# Patient Record
Sex: Male | Born: 1970 | Marital: Married | Smoking: Former smoker
Health system: Southern US, Community
[De-identification: ages and names within clinical notes are randomized; demographics above are authoritative.]

## PROBLEM LIST (undated history)

## (undated) DIAGNOSIS — Q8781 Alport syndrome: Secondary | ICD-10-CM

## (undated) DIAGNOSIS — E785 Hyperlipidemia, unspecified: Secondary | ICD-10-CM

## (undated) DIAGNOSIS — N529 Male erectile dysfunction, unspecified: Secondary | ICD-10-CM

## (undated) HISTORY — DX: Hyperlipidemia, unspecified: E78.5

## (undated) HISTORY — PX: FIBULA FRACTURE SURGERY: SHX947

## (undated) HISTORY — DX: Male erectile dysfunction, unspecified: N52.9

## (undated) HISTORY — DX: Alport syndrome: Q87.81

---

## 2014-12-17 DIAGNOSIS — J309 Allergic rhinitis, unspecified: Secondary | ICD-10-CM | POA: Insufficient documentation

## 2014-12-17 DIAGNOSIS — R062 Wheezing: Secondary | ICD-10-CM

## 2014-12-30 ENCOUNTER — Ambulatory Visit (INDEPENDENT_AMBULATORY_CARE_PROVIDER_SITE_OTHER): Payer: 59 | Admitting: *Deleted

## 2014-12-30 DIAGNOSIS — J309 Allergic rhinitis, unspecified: Secondary | ICD-10-CM

## 2015-01-06 ENCOUNTER — Ambulatory Visit (INDEPENDENT_AMBULATORY_CARE_PROVIDER_SITE_OTHER): Payer: 59 | Admitting: *Deleted

## 2015-01-06 DIAGNOSIS — J309 Allergic rhinitis, unspecified: Secondary | ICD-10-CM | POA: Diagnosis not present

## 2015-01-10 ENCOUNTER — Ambulatory Visit (INDEPENDENT_AMBULATORY_CARE_PROVIDER_SITE_OTHER): Payer: 59

## 2015-01-10 DIAGNOSIS — J309 Allergic rhinitis, unspecified: Secondary | ICD-10-CM

## 2015-01-17 ENCOUNTER — Ambulatory Visit (INDEPENDENT_AMBULATORY_CARE_PROVIDER_SITE_OTHER): Payer: 59 | Admitting: *Deleted

## 2015-01-17 DIAGNOSIS — J309 Allergic rhinitis, unspecified: Secondary | ICD-10-CM | POA: Diagnosis not present

## 2015-01-24 ENCOUNTER — Ambulatory Visit (INDEPENDENT_AMBULATORY_CARE_PROVIDER_SITE_OTHER): Payer: 59

## 2015-01-24 DIAGNOSIS — J309 Allergic rhinitis, unspecified: Secondary | ICD-10-CM

## 2015-01-31 ENCOUNTER — Ambulatory Visit (INDEPENDENT_AMBULATORY_CARE_PROVIDER_SITE_OTHER): Payer: 59 | Admitting: *Deleted

## 2015-01-31 DIAGNOSIS — J309 Allergic rhinitis, unspecified: Secondary | ICD-10-CM | POA: Diagnosis not present

## 2015-02-09 ENCOUNTER — Ambulatory Visit (INDEPENDENT_AMBULATORY_CARE_PROVIDER_SITE_OTHER): Payer: 59

## 2015-02-09 DIAGNOSIS — J309 Allergic rhinitis, unspecified: Secondary | ICD-10-CM

## 2015-02-28 ENCOUNTER — Ambulatory Visit (INDEPENDENT_AMBULATORY_CARE_PROVIDER_SITE_OTHER): Payer: 59

## 2015-02-28 DIAGNOSIS — J309 Allergic rhinitis, unspecified: Secondary | ICD-10-CM | POA: Diagnosis not present

## 2015-03-16 ENCOUNTER — Encounter: Payer: Self-pay | Admitting: Internal Medicine

## 2015-03-16 ENCOUNTER — Ambulatory Visit (INDEPENDENT_AMBULATORY_CARE_PROVIDER_SITE_OTHER): Payer: 59

## 2015-03-16 ENCOUNTER — Ambulatory Visit (INDEPENDENT_AMBULATORY_CARE_PROVIDER_SITE_OTHER): Payer: 59 | Admitting: Internal Medicine

## 2015-03-16 VITALS — BP 130/90 | HR 72 | Temp 98.2°F | Resp 16 | Ht 69.0 in | Wt 206.0 lb

## 2015-03-16 DIAGNOSIS — R062 Wheezing: Secondary | ICD-10-CM | POA: Diagnosis not present

## 2015-03-16 DIAGNOSIS — J301 Allergic rhinitis due to pollen: Secondary | ICD-10-CM | POA: Diagnosis not present

## 2015-03-16 MED ORDER — EPINEPHRINE 0.3 MG/0.3ML IJ SOAJ: INTRAMUSCULAR | Status: DC

## 2015-03-16 MED ORDER — FLUTICASONE PROPIONATE 50 MCG/ACT NA SUSP: NASAL | Status: DC

## 2015-03-16 MED ORDER — ALBUTEROL SULFATE HFA 108 (90 BASE) MCG/ACT IN AERS: 2.0000 | INHALATION_SPRAY | RESPIRATORY_TRACT | Status: DC | PRN

## 2015-03-16 NOTE — Assessment & Plan Note (Signed)
   Currently well controlled  Use pro-air prior to exercise and as needed

## 2015-03-16 NOTE — Assessment & Plan Note (Signed)
   On immunotherapy, currently well controlled  This winter, he may try using Zyrtec as needed. If symptoms remain well controlled he may also use fluticasone as needed  During grass season, would recommend using both medications regularly  Has EpiPen-educated on use

## 2015-03-16 NOTE — Progress Notes (Signed)
History of Present Illness: Kenneth Jefferson is a 44 y.o. male presenting for follow-up.  HPI Comments: Allergic rhinitis on immunotherapy: Start 10/19/2013, maintenance reached 09/21/2014. He is on injections every other week and he has been having good symptom control. He has not had any severe shot reactions or interval sinus infections. He feels benefit since starting his injections. He uses his antihistamine and Flonase on a regular basis.  Wheezing: Patient rarely requires albuterol and in those cases, it is for shortness of breath associated with strenuous exercise.   Assessment and Plan: Allergic rhinitis  On immunotherapy, currently well controlled  This winter, he may try using Zyrtec as needed. If symptoms remain well controlled he may also use fluticasone as needed  During grass season, would recommend using both medications regularly  Has EpiPen-educated on use  Wheeze  Currently well controlled  Use pro-air prior to exercise and as needed   Return in about 1 year (around 03/15/2016).  Medications ordered this encounter:  Meds ordered this encounter  Medications  . DISCONTD: vitamin B-12 (CYANOCOBALAMIN) 100 MCG tablet    Sig: Take by mouth.  . fexofenadine (ALLEGRA) 180 MG tablet    Sig: Take 180 mg by mouth.  . Flaxseed, Linseed, (FLAX SEED OIL) 1300 MG CAPS    Sig: Take by mouth.  Marland Kitchen. lisinopril (PRINIVIL,ZESTRIL) 5 MG tablet    Sig: Take 5 mg by mouth.  . Multiple Vitamin (DAILY VALUE MULTIVITAMIN) TABS    Sig: Take by mouth.  . sildenafil (VIAGRA) 100 MG tablet    Sig: Take as directed.  Marland Kitchen. DISCONTD: EPINEPHrine (EPIPEN 2-PAK) 0.3 mg/0.3 mL IJ SOAJ injection    Sig: Take as directed.  Marland Kitchen. DISCONTD: fluticasone (FLONASE) 50 MCG/ACT nasal spray    Sig: Take as directed.  Marland Kitchen. DISCONTD: tamsulosin (FLOMAX) 0.4 MG CAPS capsule    Sig: Take 0.4 mg by mouth.  . DISCONTD: lisinopril (PRINIVIL,ZESTRIL) 5 MG tablet    Sig:   . DISCONTD: tamsulosin (FLOMAX) 0.4 MG  CAPS capsule    Sig:   . atorvastatin (LIPITOR) 20 MG tablet    Sig:   . buPROPion (WELLBUTRIN XL) 150 MG 24 hr tablet    Sig:   . DISCONTD: lisinopril (PRINIVIL,ZESTRIL) 5 MG tablet    Sig:   . DISCONTD: tamsulosin (FLOMAX) 0.4 MG CAPS capsule    Sig:   . aspirin 81 MG tablet    Sig: Take 81 mg by mouth daily.  . NON FORMULARY    Sig: Inject as directed. ALLERGEN IMMUNOTHERAPY  . fluticasone (FLONASE) 50 MCG/ACT nasal spray    Sig: TWO SPRAYS EACH NOSTRIL ONCE A DAY FOR NASAL CONGESTION OR DRAINAGE.    Dispense:  16 g    Refill:  5  . albuterol (PROAIR HFA) 108 (90 BASE) MCG/ACT inhaler    Sig: Inhale 2 puffs into the lungs every 4 (four) hours as needed for wheezing or shortness of breath.    Dispense:  8 g    Refill:  2  . EPINEPHrine (EPIPEN 2-PAK) 0.3 mg/0.3 mL IJ SOAJ injection    Sig: USE AS DIRECTED FOR SEVERE ALLERGIC REACTION.    Dispense:  2 Device    Refill:  2    Diagnostics: Spirometry: FEV1 3 L or 77 %, FEV1/FVC  84 %.  This is consistent with mild restriction, no reversibility in the past.  Physical Exam: BP 130/90 mmHg  Pulse 72  Temp(Src) 98.2 F (36.8 C) (Oral)  Resp 16  Ht   (1.753 m)  Wt 206 lb (93.441 kg)  BMI 30.41 kg/m2   Physical Exam  Constitutional: He appears well-developed.  HENT:  Nose: Nose normal.  Mouth/Throat: Oropharynx is clear and moist.  Eyes: Conjunctivae are normal.  Cardiovascular: Normal rate, regular rhythm and normal heart sounds.   No murmur heard. Pulmonary/Chest: Effort normal and breath sounds normal. No respiratory distress. He has no wheezes.  Abdominal: Soft. Bowel sounds are normal.  Musculoskeletal: He exhibits no edema.  Lymphadenopathy:    He has no cervical adenopathy.  Neurological: He is alert.  Skin: No rash noted.  Vitals reviewed.   Medications: Current outpatient prescriptions:  .  albuterol (PROAIR HFA) 108 (90 BASE) MCG/ACT inhaler, Inhale 2 puffs into the lungs every 4 (four) hours as  needed for wheezing or shortness of breath., Disp: 8 g, Rfl: 2 .  aspirin 81 MG tablet, Take 81 mg by mouth daily., Disp: , Rfl:  .  atorvastatin (LIPITOR) 20 MG tablet, , Disp: , Rfl:  .  buPROPion (WELLBUTRIN XL) 150 MG 24 hr tablet, , Disp: , Rfl:  .  EPINEPHrine (EPIPEN 2-PAK) 0.3 mg/0.3 mL IJ SOAJ injection, Inject into the muscle once., Disp: , Rfl:  .  fexofenadine (ALLEGRA) 180 MG tablet, Take 180 mg by mouth., Disp: , Rfl:  .  Flaxseed, Linseed, (FLAX SEED OIL) 1300 MG CAPS, Take by mouth., Disp: , Rfl:  .  fluticasone (FLONASE) 50 MCG/ACT nasal spray, Place 2 sprays into both nostrils daily. Reported on 03/16/2015, Disp: , Rfl:  .  lisinopril (PRINIVIL,ZESTRIL) 5 MG tablet, Take 5 mg by mouth., Disp: , Rfl:  .  Multiple Vitamin (DAILY VALUE MULTIVITAMIN) TABS, Take by mouth., Disp: , Rfl:  .  NON FORMULARY, Inject as directed. ALLERGEN IMMUNOTHERAPY, Disp: , Rfl:  .  sildenafil (VIAGRA) 100 MG tablet, Take as directed., Disp: , Rfl:  .  cetirizine (ZYRTEC) 10 MG tablet, Take 10 mg by mouth daily. Reported on 03/16/2015, Disp: , Rfl:  .  EPINEPHrine (EPIPEN 2-PAK) 0.3 mg/0.3 mL IJ SOAJ injection, USE AS DIRECTED FOR SEVERE ALLERGIC REACTION., Disp: 2 Device, Rfl: 2 .  fluticasone (FLONASE) 50 MCG/ACT nasal spray, TWO SPRAYS EACH NOSTRIL ONCE A DAY FOR NASAL CONGESTION OR DRAINAGE., Disp: 16 g, Rfl: 5  Drug Allergies:  No Known Allergies  ROS: Per HPI unless specifically indicated below Review of Systems  Thank you for the opportunity to care for this patient.  Please do not hesitate to contact me with questions.

## 2015-03-16 NOTE — Patient Instructions (Signed)
Allergic rhinitis  On immunotherapy, currently well controlled  This winter, he may try using Zyrtec as needed. If symptoms remain well controlled he may also use fluticasone as needed  During grass season, would recommend using both medications regularly  Has EpiPen-educated on use  Wheeze  Currently well controlled  Use pro-air prior to exercise and as needed

## 2015-03-29 ENCOUNTER — Ambulatory Visit (INDEPENDENT_AMBULATORY_CARE_PROVIDER_SITE_OTHER): Payer: 59 | Admitting: *Deleted

## 2015-03-29 DIAGNOSIS — J309 Allergic rhinitis, unspecified: Secondary | ICD-10-CM

## 2015-03-30 DIAGNOSIS — J3089 Other allergic rhinitis: Secondary | ICD-10-CM | POA: Diagnosis not present

## 2015-03-31 DIAGNOSIS — J301 Allergic rhinitis due to pollen: Secondary | ICD-10-CM | POA: Diagnosis not present

## 2015-04-13 ENCOUNTER — Ambulatory Visit (INDEPENDENT_AMBULATORY_CARE_PROVIDER_SITE_OTHER): Payer: BLUE CROSS/BLUE SHIELD | Admitting: *Deleted

## 2015-04-13 DIAGNOSIS — J309 Allergic rhinitis, unspecified: Secondary | ICD-10-CM | POA: Diagnosis not present

## 2015-04-19 DIAGNOSIS — N401 Enlarged prostate with lower urinary tract symptoms: Secondary | ICD-10-CM | POA: Insufficient documentation

## 2015-04-19 DIAGNOSIS — Q8781 Alport syndrome: Secondary | ICD-10-CM | POA: Insufficient documentation

## 2015-04-19 DIAGNOSIS — E785 Hyperlipidemia, unspecified: Secondary | ICD-10-CM | POA: Insufficient documentation

## 2015-04-26 ENCOUNTER — Ambulatory Visit (INDEPENDENT_AMBULATORY_CARE_PROVIDER_SITE_OTHER): Payer: BLUE CROSS/BLUE SHIELD | Admitting: *Deleted

## 2015-04-26 DIAGNOSIS — J309 Allergic rhinitis, unspecified: Secondary | ICD-10-CM | POA: Diagnosis not present

## 2015-05-10 ENCOUNTER — Telehealth: Payer: Self-pay | Admitting: Internal Medicine

## 2015-05-10 ENCOUNTER — Ambulatory Visit (INDEPENDENT_AMBULATORY_CARE_PROVIDER_SITE_OTHER): Payer: BLUE CROSS/BLUE SHIELD | Admitting: *Deleted

## 2015-05-10 DIAGNOSIS — J309 Allergic rhinitis, unspecified: Secondary | ICD-10-CM | POA: Diagnosis not present

## 2015-05-10 NOTE — Telephone Encounter (Signed)
bc still has not paid - will keep checking & let him know next week

## 2015-05-10 NOTE — Telephone Encounter (Signed)
Kenneth Jefferson came in this morning and made a payment but he also wanted to check to see if [facility] was paying for his injections. He picked the [employer] up at the first of the year and no charges have come across on my screen that I could see to tell him if they are paying. Can you look at it for him. He will be back in for another shot next week and said he would check back in with [place] then.

## 2015-05-16 ENCOUNTER — Ambulatory Visit (INDEPENDENT_AMBULATORY_CARE_PROVIDER_SITE_OTHER): Payer: BLUE CROSS/BLUE SHIELD

## 2015-05-16 ENCOUNTER — Telehealth: Payer: Self-pay | Admitting: *Deleted

## 2015-05-16 DIAGNOSIS — J309 Allergic rhinitis, unspecified: Secondary | ICD-10-CM | POA: Diagnosis not present

## 2015-05-16 NOTE — Telephone Encounter (Signed)
Kenneth Jefferson,   Patient would like to know if his insurance has bad anything for this year. He is wanting to stay on top of his bill. Please call patient.

## 2015-05-16 NOTE — Telephone Encounter (Signed)
Per Olegario Messier let patient know [employer] paid for the 04/13/15 injection in full. I informed patient.

## 2015-05-26 ENCOUNTER — Ambulatory Visit (INDEPENDENT_AMBULATORY_CARE_PROVIDER_SITE_OTHER): Payer: BLUE CROSS/BLUE SHIELD

## 2015-05-26 DIAGNOSIS — J309 Allergic rhinitis, unspecified: Secondary | ICD-10-CM

## 2015-05-31 ENCOUNTER — Ambulatory Visit (INDEPENDENT_AMBULATORY_CARE_PROVIDER_SITE_OTHER): Payer: BLUE CROSS/BLUE SHIELD

## 2015-05-31 DIAGNOSIS — J309 Allergic rhinitis, unspecified: Secondary | ICD-10-CM | POA: Diagnosis not present

## 2015-06-09 ENCOUNTER — Ambulatory Visit (INDEPENDENT_AMBULATORY_CARE_PROVIDER_SITE_OTHER): Payer: BLUE CROSS/BLUE SHIELD | Admitting: *Deleted

## 2015-06-09 DIAGNOSIS — J309 Allergic rhinitis, unspecified: Secondary | ICD-10-CM

## 2015-06-20 ENCOUNTER — Ambulatory Visit (INDEPENDENT_AMBULATORY_CARE_PROVIDER_SITE_OTHER): Payer: BLUE CROSS/BLUE SHIELD

## 2015-06-20 ENCOUNTER — Telehealth: Payer: Self-pay | Admitting: Pediatrics

## 2015-06-20 DIAGNOSIS — J309 Allergic rhinitis, unspecified: Secondary | ICD-10-CM

## 2015-06-20 NOTE — Telephone Encounter (Signed)
Barbara CowerJason came into the office for his injection and requested a statement for the 2016 year. He would like for you to send it to him by mail please. Thank you!

## 2015-06-21 NOTE — Telephone Encounter (Signed)
Left message explaining bill - told him to call me back with any questions

## 2015-06-30 ENCOUNTER — Ambulatory Visit (INDEPENDENT_AMBULATORY_CARE_PROVIDER_SITE_OTHER): Payer: BLUE CROSS/BLUE SHIELD | Admitting: *Deleted

## 2015-06-30 DIAGNOSIS — J309 Allergic rhinitis, unspecified: Secondary | ICD-10-CM

## 2015-07-11 ENCOUNTER — Ambulatory Visit (INDEPENDENT_AMBULATORY_CARE_PROVIDER_SITE_OTHER): Payer: BLUE CROSS/BLUE SHIELD

## 2015-07-11 DIAGNOSIS — J309 Allergic rhinitis, unspecified: Secondary | ICD-10-CM

## 2015-07-13 DIAGNOSIS — J3089 Other allergic rhinitis: Secondary | ICD-10-CM | POA: Diagnosis not present

## 2015-07-14 DIAGNOSIS — J301 Allergic rhinitis due to pollen: Secondary | ICD-10-CM | POA: Diagnosis not present

## 2015-07-18 ENCOUNTER — Ambulatory Visit (INDEPENDENT_AMBULATORY_CARE_PROVIDER_SITE_OTHER): Payer: BLUE CROSS/BLUE SHIELD

## 2015-07-18 DIAGNOSIS — J309 Allergic rhinitis, unspecified: Secondary | ICD-10-CM | POA: Diagnosis not present

## 2015-08-01 ENCOUNTER — Ambulatory Visit (INDEPENDENT_AMBULATORY_CARE_PROVIDER_SITE_OTHER): Payer: BLUE CROSS/BLUE SHIELD

## 2015-08-01 DIAGNOSIS — J309 Allergic rhinitis, unspecified: Secondary | ICD-10-CM

## 2015-08-16 ENCOUNTER — Ambulatory Visit (INDEPENDENT_AMBULATORY_CARE_PROVIDER_SITE_OTHER): Payer: BLUE CROSS/BLUE SHIELD

## 2015-08-16 DIAGNOSIS — J309 Allergic rhinitis, unspecified: Secondary | ICD-10-CM | POA: Diagnosis not present

## 2015-08-23 ENCOUNTER — Ambulatory Visit (INDEPENDENT_AMBULATORY_CARE_PROVIDER_SITE_OTHER): Payer: BLUE CROSS/BLUE SHIELD

## 2015-08-23 DIAGNOSIS — J309 Allergic rhinitis, unspecified: Secondary | ICD-10-CM

## 2015-08-31 ENCOUNTER — Ambulatory Visit (INDEPENDENT_AMBULATORY_CARE_PROVIDER_SITE_OTHER): Payer: BLUE CROSS/BLUE SHIELD

## 2015-08-31 DIAGNOSIS — J309 Allergic rhinitis, unspecified: Secondary | ICD-10-CM | POA: Diagnosis not present

## 2015-09-07 ENCOUNTER — Ambulatory Visit (INDEPENDENT_AMBULATORY_CARE_PROVIDER_SITE_OTHER): Payer: BLUE CROSS/BLUE SHIELD

## 2015-09-07 DIAGNOSIS — J309 Allergic rhinitis, unspecified: Secondary | ICD-10-CM

## 2015-09-19 ENCOUNTER — Ambulatory Visit (INDEPENDENT_AMBULATORY_CARE_PROVIDER_SITE_OTHER): Payer: BLUE CROSS/BLUE SHIELD

## 2015-09-19 DIAGNOSIS — J309 Allergic rhinitis, unspecified: Secondary | ICD-10-CM | POA: Diagnosis not present

## 2015-10-05 ENCOUNTER — Ambulatory Visit (INDEPENDENT_AMBULATORY_CARE_PROVIDER_SITE_OTHER): Payer: BLUE CROSS/BLUE SHIELD

## 2015-10-05 DIAGNOSIS — J309 Allergic rhinitis, unspecified: Secondary | ICD-10-CM

## 2015-10-17 ENCOUNTER — Ambulatory Visit (INDEPENDENT_AMBULATORY_CARE_PROVIDER_SITE_OTHER): Payer: BLUE CROSS/BLUE SHIELD

## 2015-10-17 DIAGNOSIS — J309 Allergic rhinitis, unspecified: Secondary | ICD-10-CM

## 2015-10-31 ENCOUNTER — Ambulatory Visit (INDEPENDENT_AMBULATORY_CARE_PROVIDER_SITE_OTHER): Payer: BLUE CROSS/BLUE SHIELD

## 2015-10-31 DIAGNOSIS — J309 Allergic rhinitis, unspecified: Secondary | ICD-10-CM

## 2015-11-09 DIAGNOSIS — J3089 Other allergic rhinitis: Secondary | ICD-10-CM | POA: Diagnosis not present

## 2015-11-10 DIAGNOSIS — J301 Allergic rhinitis due to pollen: Secondary | ICD-10-CM | POA: Diagnosis not present

## 2015-11-15 ENCOUNTER — Ambulatory Visit (INDEPENDENT_AMBULATORY_CARE_PROVIDER_SITE_OTHER): Payer: BLUE CROSS/BLUE SHIELD

## 2015-11-15 DIAGNOSIS — J309 Allergic rhinitis, unspecified: Secondary | ICD-10-CM | POA: Diagnosis not present

## 2015-11-28 ENCOUNTER — Ambulatory Visit (INDEPENDENT_AMBULATORY_CARE_PROVIDER_SITE_OTHER): Payer: BLUE CROSS/BLUE SHIELD

## 2015-11-28 DIAGNOSIS — J309 Allergic rhinitis, unspecified: Secondary | ICD-10-CM

## 2015-12-12 ENCOUNTER — Ambulatory Visit (INDEPENDENT_AMBULATORY_CARE_PROVIDER_SITE_OTHER): Payer: BLUE CROSS/BLUE SHIELD

## 2015-12-12 DIAGNOSIS — J309 Allergic rhinitis, unspecified: Secondary | ICD-10-CM

## 2015-12-22 ENCOUNTER — Ambulatory Visit (INDEPENDENT_AMBULATORY_CARE_PROVIDER_SITE_OTHER): Payer: BLUE CROSS/BLUE SHIELD

## 2015-12-22 DIAGNOSIS — J309 Allergic rhinitis, unspecified: Secondary | ICD-10-CM | POA: Diagnosis not present

## 2015-12-27 ENCOUNTER — Ambulatory Visit (INDEPENDENT_AMBULATORY_CARE_PROVIDER_SITE_OTHER): Payer: BLUE CROSS/BLUE SHIELD

## 2015-12-27 DIAGNOSIS — J309 Allergic rhinitis, unspecified: Secondary | ICD-10-CM

## 2016-01-05 ENCOUNTER — Ambulatory Visit (INDEPENDENT_AMBULATORY_CARE_PROVIDER_SITE_OTHER): Payer: BLUE CROSS/BLUE SHIELD

## 2016-01-05 DIAGNOSIS — J309 Allergic rhinitis, unspecified: Secondary | ICD-10-CM | POA: Diagnosis not present

## 2016-01-12 ENCOUNTER — Ambulatory Visit (INDEPENDENT_AMBULATORY_CARE_PROVIDER_SITE_OTHER): Payer: BLUE CROSS/BLUE SHIELD

## 2016-01-12 DIAGNOSIS — J309 Allergic rhinitis, unspecified: Secondary | ICD-10-CM | POA: Diagnosis not present

## 2016-02-02 ENCOUNTER — Ambulatory Visit (INDEPENDENT_AMBULATORY_CARE_PROVIDER_SITE_OTHER): Payer: BLUE CROSS/BLUE SHIELD

## 2016-02-02 DIAGNOSIS — J309 Allergic rhinitis, unspecified: Secondary | ICD-10-CM

## 2016-02-14 ENCOUNTER — Ambulatory Visit (INDEPENDENT_AMBULATORY_CARE_PROVIDER_SITE_OTHER): Payer: BLUE CROSS/BLUE SHIELD

## 2016-02-14 DIAGNOSIS — J309 Allergic rhinitis, unspecified: Secondary | ICD-10-CM | POA: Diagnosis not present

## 2016-02-28 ENCOUNTER — Ambulatory Visit (INDEPENDENT_AMBULATORY_CARE_PROVIDER_SITE_OTHER): Payer: BLUE CROSS/BLUE SHIELD

## 2016-02-28 DIAGNOSIS — J309 Allergic rhinitis, unspecified: Secondary | ICD-10-CM | POA: Diagnosis not present

## 2016-03-08 DIAGNOSIS — J301 Allergic rhinitis due to pollen: Secondary | ICD-10-CM

## 2016-03-09 DIAGNOSIS — J3089 Other allergic rhinitis: Secondary | ICD-10-CM | POA: Diagnosis not present

## 2016-03-12 ENCOUNTER — Ambulatory Visit (INDEPENDENT_AMBULATORY_CARE_PROVIDER_SITE_OTHER): Payer: BLUE CROSS/BLUE SHIELD

## 2016-03-12 DIAGNOSIS — J309 Allergic rhinitis, unspecified: Secondary | ICD-10-CM | POA: Diagnosis not present

## 2016-03-29 ENCOUNTER — Ambulatory Visit (INDEPENDENT_AMBULATORY_CARE_PROVIDER_SITE_OTHER): Payer: BLUE CROSS/BLUE SHIELD

## 2016-03-29 DIAGNOSIS — J309 Allergic rhinitis, unspecified: Secondary | ICD-10-CM | POA: Diagnosis not present

## 2016-04-11 ENCOUNTER — Ambulatory Visit (INDEPENDENT_AMBULATORY_CARE_PROVIDER_SITE_OTHER): Payer: BLUE CROSS/BLUE SHIELD | Admitting: *Deleted

## 2016-04-11 ENCOUNTER — Telehealth: Payer: Self-pay | Admitting: *Deleted

## 2016-04-11 DIAGNOSIS — J309 Allergic rhinitis, unspecified: Secondary | ICD-10-CM | POA: Diagnosis not present

## 2016-04-11 NOTE — Telephone Encounter (Signed)
Mailed today

## 2016-04-11 NOTE — Telephone Encounter (Signed)
Pt need a list of payments made in 2017 mailed to him.

## 2016-04-20 ENCOUNTER — Ambulatory Visit (INDEPENDENT_AMBULATORY_CARE_PROVIDER_SITE_OTHER): Payer: BLUE CROSS/BLUE SHIELD

## 2016-04-20 DIAGNOSIS — J309 Allergic rhinitis, unspecified: Secondary | ICD-10-CM

## 2016-04-23 ENCOUNTER — Other Ambulatory Visit: Payer: Self-pay | Admitting: Allergy

## 2016-04-23 ENCOUNTER — Ambulatory Visit (INDEPENDENT_AMBULATORY_CARE_PROVIDER_SITE_OTHER): Payer: BLUE CROSS/BLUE SHIELD

## 2016-04-23 DIAGNOSIS — J309 Allergic rhinitis, unspecified: Secondary | ICD-10-CM | POA: Diagnosis not present

## 2016-04-23 MED ORDER — EPINEPHRINE 0.3 MG/0.3ML IJ SOAJ: 0.3000 mg | Freq: Once | INTRAMUSCULAR | 1 refills | Status: AC

## 2016-05-08 ENCOUNTER — Ambulatory Visit (INDEPENDENT_AMBULATORY_CARE_PROVIDER_SITE_OTHER): Payer: BLUE CROSS/BLUE SHIELD

## 2016-05-08 DIAGNOSIS — J309 Allergic rhinitis, unspecified: Secondary | ICD-10-CM | POA: Diagnosis not present

## 2016-05-14 ENCOUNTER — Ambulatory Visit (INDEPENDENT_AMBULATORY_CARE_PROVIDER_SITE_OTHER): Payer: BLUE CROSS/BLUE SHIELD

## 2016-05-14 DIAGNOSIS — J309 Allergic rhinitis, unspecified: Secondary | ICD-10-CM

## 2016-05-28 ENCOUNTER — Ambulatory Visit (INDEPENDENT_AMBULATORY_CARE_PROVIDER_SITE_OTHER): Payer: BLUE CROSS/BLUE SHIELD

## 2016-05-28 DIAGNOSIS — J309 Allergic rhinitis, unspecified: Secondary | ICD-10-CM

## 2016-06-05 NOTE — Addendum Note (Signed)
Addended by: Berna BueWHITAKER, CARRIE L on: 06/05/2016 08:50 AM   Modules accepted: Orders

## 2016-06-11 ENCOUNTER — Ambulatory Visit (INDEPENDENT_AMBULATORY_CARE_PROVIDER_SITE_OTHER): Payer: BLUE CROSS/BLUE SHIELD

## 2016-06-11 DIAGNOSIS — J309 Allergic rhinitis, unspecified: Secondary | ICD-10-CM | POA: Diagnosis not present

## 2016-06-28 ENCOUNTER — Ambulatory Visit (INDEPENDENT_AMBULATORY_CARE_PROVIDER_SITE_OTHER): Payer: BLUE CROSS/BLUE SHIELD | Admitting: *Deleted

## 2016-06-28 DIAGNOSIS — J309 Allergic rhinitis, unspecified: Secondary | ICD-10-CM | POA: Diagnosis not present

## 2016-07-10 ENCOUNTER — Ambulatory Visit (INDEPENDENT_AMBULATORY_CARE_PROVIDER_SITE_OTHER): Payer: BLUE CROSS/BLUE SHIELD

## 2016-07-10 DIAGNOSIS — J309 Allergic rhinitis, unspecified: Secondary | ICD-10-CM | POA: Diagnosis not present

## 2016-07-23 ENCOUNTER — Ambulatory Visit (INDEPENDENT_AMBULATORY_CARE_PROVIDER_SITE_OTHER): Payer: BLUE CROSS/BLUE SHIELD

## 2016-07-23 DIAGNOSIS — J309 Allergic rhinitis, unspecified: Secondary | ICD-10-CM | POA: Diagnosis not present

## 2016-07-25 DIAGNOSIS — J3089 Other allergic rhinitis: Secondary | ICD-10-CM | POA: Diagnosis not present

## 2016-08-06 ENCOUNTER — Ambulatory Visit (INDEPENDENT_AMBULATORY_CARE_PROVIDER_SITE_OTHER): Payer: BLUE CROSS/BLUE SHIELD

## 2016-08-06 DIAGNOSIS — J309 Allergic rhinitis, unspecified: Secondary | ICD-10-CM | POA: Diagnosis not present

## 2016-08-20 ENCOUNTER — Ambulatory Visit (INDEPENDENT_AMBULATORY_CARE_PROVIDER_SITE_OTHER): Payer: BLUE CROSS/BLUE SHIELD

## 2016-08-20 DIAGNOSIS — J309 Allergic rhinitis, unspecified: Secondary | ICD-10-CM | POA: Diagnosis not present

## 2016-08-28 ENCOUNTER — Ambulatory Visit (INDEPENDENT_AMBULATORY_CARE_PROVIDER_SITE_OTHER): Payer: BLUE CROSS/BLUE SHIELD

## 2016-08-28 DIAGNOSIS — J309 Allergic rhinitis, unspecified: Secondary | ICD-10-CM

## 2016-09-11 ENCOUNTER — Ambulatory Visit (INDEPENDENT_AMBULATORY_CARE_PROVIDER_SITE_OTHER): Payer: BLUE CROSS/BLUE SHIELD

## 2016-09-11 DIAGNOSIS — J309 Allergic rhinitis, unspecified: Secondary | ICD-10-CM

## 2016-09-17 ENCOUNTER — Ambulatory Visit (INDEPENDENT_AMBULATORY_CARE_PROVIDER_SITE_OTHER): Payer: BLUE CROSS/BLUE SHIELD | Admitting: *Deleted

## 2016-09-17 DIAGNOSIS — J309 Allergic rhinitis, unspecified: Secondary | ICD-10-CM

## 2016-09-27 ENCOUNTER — Ambulatory Visit (INDEPENDENT_AMBULATORY_CARE_PROVIDER_SITE_OTHER): Payer: BLUE CROSS/BLUE SHIELD | Admitting: *Deleted

## 2016-09-27 DIAGNOSIS — J309 Allergic rhinitis, unspecified: Secondary | ICD-10-CM | POA: Diagnosis not present

## 2016-10-10 ENCOUNTER — Ambulatory Visit (INDEPENDENT_AMBULATORY_CARE_PROVIDER_SITE_OTHER): Payer: BLUE CROSS/BLUE SHIELD | Admitting: *Deleted

## 2016-10-10 DIAGNOSIS — J309 Allergic rhinitis, unspecified: Secondary | ICD-10-CM | POA: Diagnosis not present

## 2016-10-22 ENCOUNTER — Ambulatory Visit (INDEPENDENT_AMBULATORY_CARE_PROVIDER_SITE_OTHER): Payer: BLUE CROSS/BLUE SHIELD

## 2016-10-22 DIAGNOSIS — J309 Allergic rhinitis, unspecified: Secondary | ICD-10-CM | POA: Diagnosis not present

## 2016-11-05 NOTE — Progress Notes (Signed)
VIALS EXP 11-08-17 

## 2016-11-08 ENCOUNTER — Ambulatory Visit (INDEPENDENT_AMBULATORY_CARE_PROVIDER_SITE_OTHER): Payer: BLUE CROSS/BLUE SHIELD

## 2016-11-08 DIAGNOSIS — J309 Allergic rhinitis, unspecified: Secondary | ICD-10-CM

## 2016-11-09 DIAGNOSIS — J3089 Other allergic rhinitis: Secondary | ICD-10-CM | POA: Diagnosis not present

## 2016-11-21 ENCOUNTER — Ambulatory Visit (INDEPENDENT_AMBULATORY_CARE_PROVIDER_SITE_OTHER): Payer: BLUE CROSS/BLUE SHIELD

## 2016-11-21 DIAGNOSIS — J309 Allergic rhinitis, unspecified: Secondary | ICD-10-CM

## 2016-12-06 ENCOUNTER — Ambulatory Visit (INDEPENDENT_AMBULATORY_CARE_PROVIDER_SITE_OTHER): Payer: BLUE CROSS/BLUE SHIELD

## 2016-12-06 DIAGNOSIS — J309 Allergic rhinitis, unspecified: Secondary | ICD-10-CM

## 2016-12-13 DIAGNOSIS — I10 Essential (primary) hypertension: Secondary | ICD-10-CM | POA: Insufficient documentation

## 2016-12-25 ENCOUNTER — Ambulatory Visit: Payer: Self-pay

## 2016-12-25 ENCOUNTER — Ambulatory Visit (INDEPENDENT_AMBULATORY_CARE_PROVIDER_SITE_OTHER): Payer: BLUE CROSS/BLUE SHIELD | Admitting: Pediatrics

## 2016-12-25 ENCOUNTER — Encounter: Payer: Self-pay | Admitting: Pediatrics

## 2016-12-25 VITALS — BP 142/86 | HR 80 | Temp 98.1°F | Resp 16

## 2016-12-25 DIAGNOSIS — J309 Allergic rhinitis, unspecified: Secondary | ICD-10-CM | POA: Diagnosis not present

## 2016-12-25 DIAGNOSIS — J4520 Mild intermittent asthma, uncomplicated: Secondary | ICD-10-CM

## 2016-12-25 DIAGNOSIS — I10 Essential (primary) hypertension: Secondary | ICD-10-CM | POA: Insufficient documentation

## 2016-12-25 MED ORDER — ALBUTEROL SULFATE HFA 108 (90 BASE) MCG/ACT IN AERS: 2.0000 | INHALATION_SPRAY | RESPIRATORY_TRACT | 1 refills | Status: DC | PRN

## 2016-12-25 MED ORDER — FLUTICASONE PROPIONATE 50 MCG/ACT NA SUSP: NASAL | 5 refills | Status: DC

## 2016-12-25 MED ORDER — EPINEPHRINE 0.3 MG/0.3ML IJ SOAJ: INTRAMUSCULAR | 1 refills | Status: DC

## 2016-12-25 NOTE — Patient Instructions (Signed)
Zyrtec 10 mg-take 1 tablet once a day if needed for runny nose Fluticasone-2 sprays per nostril once a day if needed for stuffy nose Pro-air-2 puffs every 4 hours if needed for wheezing or coughing spells. You may use Pro-air 2 puffs 5-15 minutes before exercise You  should have a flu vaccination this fall Continue on your other medications Call us if you're not doing well on this treatment plan Allergy injections every 3 weeks once you have reached maintenance dose

## 2016-12-25 NOTE — Progress Notes (Addendum)
[address] [place] [address] Dept: [phone]  FOLLOW UP NOTE  Patient ID: Kenneth Jefferson, male    DOB: 03-18-1971  Age: 46 y.o. MRN: [id number] Date of Office Visit: 12/25/2016  Assessment  Chief Complaint: Allergic Rhinitis  (doing well) and Wheezing (doing well)  HPI Kenneth Jefferson presents for follow-up of asthma and allergic rhinitis. His asthma is well controlled and he uses Pro-air once or twice every 3 months. His nasal symptoms are dramatically improved since he was started on allergy injections. He has been on allergy injections every 2 weeks for the past year. He does not have to use fluticasone on a regular basis. He uses Zyrtec 10 mg once a day if needed. His blood pressure is well controlled on lisinopril  Current medications are outlined in the chart   Drug Allergies:  No Known Allergies  Physical Exam: BP (!) 142/86 (BP Location: Left Arm, Patient Position: Sitting, Cuff Size: Normal)   Pulse 80   Temp 98.1 F (36.7 C) (Oral)   Resp 16   SpO2 95%    Physical Exam  Constitutional: He is oriented to person, place, and time. He appears well-developed and well-nourished.  HENT:  Eyes normal. Ears normal. Nose normal. Pharynx normal.  Neck: Neck supple.  Cardiovascular:  S1 and S2 normal no murmurs  Pulmonary/Chest:  Clear to percussion and auscultation  Lymphadenopathy:    He has no cervical adenopathy.  Neurological: He is alert and oriented to person, place, and time.  Psychiatric: He has a normal mood and affect. His behavior is normal. Judgment and thought content normal.  Vitals reviewed.   Diagnostics:  FVC 3.77 L FEV1 3.19 L. Predicted FVC 4.93 L predicted FEV1 3.85 L-the spirometry is in the normal range  Assessment and Plan: 1. Mild intermittent asthma without complication   2. Allergic rhinitis, unspecified seasonality, unspecified trigger   3. Essential hypertension     Meds ordered this encounter  Medications  . albuterol  (PROAIR HFA) 108 (90 Base) MCG/ACT inhaler    Sig: Inhale 2 puffs into the lungs every 4 (four) hours as needed for wheezing or shortness of breath.    Dispense:  1 Inhaler    Refill:  1    Please keep rx on file. Pt. Will call when needed  . fluticasone (FLONASE) 50 MCG/ACT nasal spray    Sig: 2 sprays per nostril if needed for stuffy nose.    Dispense:  16 g    Refill:  5    Please keep rx on file. Pt. Will call when needed.  Marland Kitchen EPINEPHrine (EPIPEN 2-PAK) 0.3 mg/0.3 mL IJ SOAJ injection    Sig: USE AS DIRECTED FOR SEVERE ALLERGIC REACTION.    Dispense:  2 Device    Refill:  1    Dispense mylan generic or brand name whichever is cheaper with pt's insurance. Please keep rx on file. Pt. Will call when needed.    Patient Instructions  Zyrtec 10 mg-take 1 tablet once a day if needed for runny nose Fluticasone-2 sprays per nostril once a day if needed for stuffy nose Pro-air-2 puffs every 4 hours if needed for wheezing or coughing spells. You may use Pro-air 2 puffs 5-15 minutes before exercise You  should have a flu vaccination this fall Continue on your other medications Call us if you're not doing well on this treatment plan Allergy injections every 3 weeks once you have reached maintenance dose   Return in about 1 year (  around 12/25/2017).    Thank you for the opportunity to care for this patient.  Please do not hesitate to contact me with questions.  Tonette Bihari, M.D.  [facility] [address] [place], [place] [address] [phone]

## 2017-01-01 ENCOUNTER — Ambulatory Visit (INDEPENDENT_AMBULATORY_CARE_PROVIDER_SITE_OTHER): Payer: BLUE CROSS/BLUE SHIELD

## 2017-01-01 DIAGNOSIS — J309 Allergic rhinitis, unspecified: Secondary | ICD-10-CM

## 2017-01-10 ENCOUNTER — Ambulatory Visit (INDEPENDENT_AMBULATORY_CARE_PROVIDER_SITE_OTHER): Payer: BLUE CROSS/BLUE SHIELD

## 2017-01-10 DIAGNOSIS — J309 Allergic rhinitis, unspecified: Secondary | ICD-10-CM | POA: Diagnosis not present

## 2017-01-16 ENCOUNTER — Ambulatory Visit (INDEPENDENT_AMBULATORY_CARE_PROVIDER_SITE_OTHER): Payer: BLUE CROSS/BLUE SHIELD

## 2017-01-16 DIAGNOSIS — J309 Allergic rhinitis, unspecified: Secondary | ICD-10-CM | POA: Diagnosis not present

## 2017-01-23 ENCOUNTER — Ambulatory Visit (INDEPENDENT_AMBULATORY_CARE_PROVIDER_SITE_OTHER): Payer: BLUE CROSS/BLUE SHIELD

## 2017-01-23 DIAGNOSIS — J309 Allergic rhinitis, unspecified: Secondary | ICD-10-CM | POA: Diagnosis not present

## 2017-01-29 ENCOUNTER — Other Ambulatory Visit: Payer: Self-pay | Admitting: Allergy

## 2017-01-29 MED ORDER — ALBUTEROL SULFATE HFA 108 (90 BASE) MCG/ACT IN AERS: 2.0000 | INHALATION_SPRAY | RESPIRATORY_TRACT | 1 refills | Status: DC | PRN

## 2017-02-19 ENCOUNTER — Ambulatory Visit (INDEPENDENT_AMBULATORY_CARE_PROVIDER_SITE_OTHER): Payer: BLUE CROSS/BLUE SHIELD

## 2017-02-19 DIAGNOSIS — J309 Allergic rhinitis, unspecified: Secondary | ICD-10-CM

## 2017-03-14 ENCOUNTER — Ambulatory Visit (INDEPENDENT_AMBULATORY_CARE_PROVIDER_SITE_OTHER): Payer: BLUE CROSS/BLUE SHIELD

## 2017-03-14 DIAGNOSIS — J309 Allergic rhinitis, unspecified: Secondary | ICD-10-CM

## 2017-04-11 ENCOUNTER — Ambulatory Visit (INDEPENDENT_AMBULATORY_CARE_PROVIDER_SITE_OTHER): Payer: BLUE CROSS/BLUE SHIELD

## 2017-04-11 DIAGNOSIS — J309 Allergic rhinitis, unspecified: Secondary | ICD-10-CM

## 2017-04-17 DIAGNOSIS — J3089 Other allergic rhinitis: Secondary | ICD-10-CM | POA: Diagnosis not present

## 2017-04-17 NOTE — Progress Notes (Signed)
VIALS EXP 02-06-19 

## 2017-06-05 ENCOUNTER — Ambulatory Visit (INDEPENDENT_AMBULATORY_CARE_PROVIDER_SITE_OTHER): Payer: BLUE CROSS/BLUE SHIELD

## 2017-06-05 DIAGNOSIS — J309 Allergic rhinitis, unspecified: Secondary | ICD-10-CM

## 2017-06-17 ENCOUNTER — Ambulatory Visit (INDEPENDENT_AMBULATORY_CARE_PROVIDER_SITE_OTHER): Payer: BLUE CROSS/BLUE SHIELD

## 2017-06-17 DIAGNOSIS — J309 Allergic rhinitis, unspecified: Secondary | ICD-10-CM | POA: Diagnosis not present

## 2017-06-27 ENCOUNTER — Ambulatory Visit (INDEPENDENT_AMBULATORY_CARE_PROVIDER_SITE_OTHER): Payer: BLUE CROSS/BLUE SHIELD

## 2017-06-27 DIAGNOSIS — J309 Allergic rhinitis, unspecified: Secondary | ICD-10-CM | POA: Diagnosis not present

## 2017-07-01 ENCOUNTER — Ambulatory Visit (INDEPENDENT_AMBULATORY_CARE_PROVIDER_SITE_OTHER): Payer: BLUE CROSS/BLUE SHIELD | Admitting: *Deleted

## 2017-07-01 DIAGNOSIS — J309 Allergic rhinitis, unspecified: Secondary | ICD-10-CM

## 2017-07-09 ENCOUNTER — Ambulatory Visit (INDEPENDENT_AMBULATORY_CARE_PROVIDER_SITE_OTHER): Payer: BLUE CROSS/BLUE SHIELD

## 2017-07-09 DIAGNOSIS — J309 Allergic rhinitis, unspecified: Secondary | ICD-10-CM | POA: Diagnosis not present

## 2017-07-22 ENCOUNTER — Ambulatory Visit (INDEPENDENT_AMBULATORY_CARE_PROVIDER_SITE_OTHER): Payer: BLUE CROSS/BLUE SHIELD

## 2017-07-22 DIAGNOSIS — J309 Allergic rhinitis, unspecified: Secondary | ICD-10-CM

## 2017-08-08 ENCOUNTER — Ambulatory Visit (INDEPENDENT_AMBULATORY_CARE_PROVIDER_SITE_OTHER): Payer: BLUE CROSS/BLUE SHIELD

## 2017-08-08 DIAGNOSIS — J309 Allergic rhinitis, unspecified: Secondary | ICD-10-CM | POA: Diagnosis not present

## 2017-08-15 ENCOUNTER — Ambulatory Visit (INDEPENDENT_AMBULATORY_CARE_PROVIDER_SITE_OTHER): Payer: BLUE CROSS/BLUE SHIELD

## 2017-08-15 DIAGNOSIS — J309 Allergic rhinitis, unspecified: Secondary | ICD-10-CM | POA: Diagnosis not present

## 2017-08-28 ENCOUNTER — Ambulatory Visit (INDEPENDENT_AMBULATORY_CARE_PROVIDER_SITE_OTHER): Payer: BLUE CROSS/BLUE SHIELD

## 2017-08-28 DIAGNOSIS — J309 Allergic rhinitis, unspecified: Secondary | ICD-10-CM

## 2017-09-09 ENCOUNTER — Ambulatory Visit (INDEPENDENT_AMBULATORY_CARE_PROVIDER_SITE_OTHER): Payer: BLUE CROSS/BLUE SHIELD

## 2017-09-09 DIAGNOSIS — J309 Allergic rhinitis, unspecified: Secondary | ICD-10-CM

## 2017-09-23 ENCOUNTER — Ambulatory Visit (INDEPENDENT_AMBULATORY_CARE_PROVIDER_SITE_OTHER): Payer: BLUE CROSS/BLUE SHIELD

## 2017-09-23 DIAGNOSIS — J309 Allergic rhinitis, unspecified: Secondary | ICD-10-CM

## 2017-09-25 ENCOUNTER — Encounter: Payer: Self-pay | Admitting: *Deleted

## 2017-09-25 NOTE — Progress Notes (Signed)
Maintenance vial made. Exp: 09-26-18. hv 

## 2017-10-02 DIAGNOSIS — J3089 Other allergic rhinitis: Secondary | ICD-10-CM | POA: Diagnosis not present

## 2017-10-10 ENCOUNTER — Ambulatory Visit (INDEPENDENT_AMBULATORY_CARE_PROVIDER_SITE_OTHER): Payer: BLUE CROSS/BLUE SHIELD | Admitting: *Deleted

## 2017-10-10 DIAGNOSIS — J309 Allergic rhinitis, unspecified: Secondary | ICD-10-CM | POA: Diagnosis not present

## 2017-10-28 ENCOUNTER — Ambulatory Visit (INDEPENDENT_AMBULATORY_CARE_PROVIDER_SITE_OTHER): Payer: BLUE CROSS/BLUE SHIELD

## 2017-10-28 DIAGNOSIS — J309 Allergic rhinitis, unspecified: Secondary | ICD-10-CM | POA: Diagnosis not present

## 2017-11-05 ENCOUNTER — Ambulatory Visit (INDEPENDENT_AMBULATORY_CARE_PROVIDER_SITE_OTHER): Payer: BLUE CROSS/BLUE SHIELD

## 2017-11-05 DIAGNOSIS — J309 Allergic rhinitis, unspecified: Secondary | ICD-10-CM | POA: Diagnosis not present

## 2017-11-14 ENCOUNTER — Ambulatory Visit (INDEPENDENT_AMBULATORY_CARE_PROVIDER_SITE_OTHER): Payer: BLUE CROSS/BLUE SHIELD

## 2017-11-14 DIAGNOSIS — J309 Allergic rhinitis, unspecified: Secondary | ICD-10-CM

## 2017-11-20 ENCOUNTER — Ambulatory Visit (INDEPENDENT_AMBULATORY_CARE_PROVIDER_SITE_OTHER): Payer: BLUE CROSS/BLUE SHIELD | Admitting: *Deleted

## 2017-11-20 DIAGNOSIS — J309 Allergic rhinitis, unspecified: Secondary | ICD-10-CM | POA: Diagnosis not present

## 2017-11-26 ENCOUNTER — Ambulatory Visit (INDEPENDENT_AMBULATORY_CARE_PROVIDER_SITE_OTHER): Payer: BLUE CROSS/BLUE SHIELD

## 2017-11-26 DIAGNOSIS — J309 Allergic rhinitis, unspecified: Secondary | ICD-10-CM

## 2017-12-09 ENCOUNTER — Ambulatory Visit (INDEPENDENT_AMBULATORY_CARE_PROVIDER_SITE_OTHER): Payer: BLUE CROSS/BLUE SHIELD

## 2017-12-09 DIAGNOSIS — J309 Allergic rhinitis, unspecified: Secondary | ICD-10-CM

## 2017-12-23 ENCOUNTER — Ambulatory Visit: Payer: BLUE CROSS/BLUE SHIELD | Admitting: Pediatrics

## 2017-12-23 ENCOUNTER — Encounter: Payer: Self-pay | Admitting: Pediatrics

## 2017-12-23 VITALS — BP 144/88 | HR 75 | Temp 98.0°F | Resp 16 | Ht 69.0 in | Wt 207.5 lb

## 2017-12-23 DIAGNOSIS — J4520 Mild intermittent asthma, uncomplicated: Secondary | ICD-10-CM | POA: Diagnosis not present

## 2017-12-23 DIAGNOSIS — J309 Allergic rhinitis, unspecified: Secondary | ICD-10-CM

## 2017-12-23 DIAGNOSIS — I10 Essential (primary) hypertension: Secondary | ICD-10-CM | POA: Diagnosis not present

## 2017-12-23 MED ORDER — ALBUTEROL SULFATE HFA 108 (90 BASE) MCG/ACT IN AERS: 2.0000 | INHALATION_SPRAY | RESPIRATORY_TRACT | 1 refills | Status: DC | PRN

## 2017-12-23 MED ORDER — FLUTICASONE PROPIONATE 50 MCG/ACT NA SUSP: NASAL | 5 refills | Status: DC

## 2017-12-23 MED ORDER — EPINEPHRINE 0.3 MG/0.3ML IJ SOAJ: INTRAMUSCULAR | 1 refills | Status: DC

## 2017-12-23 NOTE — Patient Instructions (Addendum)
Zyrtec 10 mg-take 1 tablet once a day if needed for runny nose Fluticasone-2 sprays per nostril once a day if needed for stuffy nose ProAir-2 puffs every 4 hours if needed for cough or wheeze. Use ProAir 2 puffs 5-15 minutes before exercise to reduce cough or wheeze Continue allergy injections every 2 weeks  Follow up with your primary care provider for your blood pressure  Continue on your other medications as listed in your chart  Call us if you're not doing well on this treatment plan  Follow up in 6 months or sooner if needed

## 2017-12-23 NOTE — Progress Notes (Signed)
[address] [place] [id number] Dept: [phone]  FOLLOW UP NOTE  Patient ID: Kenneth Jefferson, male    DOB: 05/23/1970  Age: 47 y.o. MRN: [id number] Date of Office Visit: 12/23/2017  Assessment  Chief Complaint: Allergic Rhinitis  (doing well) and Asthma (doing well)  HPI Kenneth AnoCharles J Talwar is a 47 year old male who presents to the clinic for a follow up visit. He reports his asthma and allergic rhinitis have been well controlled. He continues allergy immunotherapy every 2 weeks and reports a significant improvement in his rhinitis since beginning allergy shots. His current medications are listed in the chart.   Drug Allergies:  No Known Allergies  Physical Exam: BP (!) 144/88 (BP Location: Left Arm, Patient Position: Sitting, Cuff Size: Normal)   Pulse 75   Temp 98 F (36.7 C) (Oral)   Resp 16   Ht 5\' 9"  (1.753 m)   Wt 207 lb 7.3 oz (94.1 kg)   SpO2 96%   BMI 30.64 kg/m    Physical Exam  Constitutional: He is oriented to person, place, and time. He appears well-developed and well-nourished.  HENT:  Head: Normocephalic and atraumatic.  Right Ear: External ear normal.  Left Ear: External ear normal.  Nose: Nose normal.  Mouth/Throat: Oropharynx is clear and moist.  Bilateral nares normal. Pharynx normal. Ears normal. Eyes normal.  Eyes: Conjunctivae are normal.  Neck: Normal range of motion. Neck supple.  Cardiovascular: Normal rate, regular rhythm and normal heart sounds.  No murmur noted  Pulmonary/Chest: Effort normal and breath sounds normal.  Lungs clear to auscultation  Musculoskeletal: Normal range of motion.  Neurological: He is alert and oriented to person, place, and time.  Skin: Skin is warm and dry.  Psychiatric: He has a normal mood and affect. His behavior is normal. Judgment and thought content normal.  Vitals reviewed.   Diagnostics: FVC 3.36, FEV1 2.96. Predicted FVC 4.90, predicted FEV1 3.82. Spirometry indicates mild restriction. This is  consistent with previous readings.   Assessment and Plan: 1. Allergic rhinitis, unspecified seasonality, unspecified trigger   2. Mild intermittent asthma without complication   3. Essential hypertension     Meds ordered this encounter  Medications  . fluticasone (FLONASE) 50 MCG/ACT nasal spray    Sig: 2 sprays per nostril if needed for stuffy nose.    Dispense:  16 g    Refill:  5    Please keep rx on file. Pt. Will call when needed.  Marland Kitchen. albuterol (PROAIR HFA) 108 (90 Base) MCG/ACT inhaler    Sig: Inhale 2 puffs into the lungs every 4 (four) hours as needed for wheezing or shortness of breath.    Dispense:  1 Inhaler    Refill:  1    Please keep rx on file. Pt. Will call when needed.  Marland Kitchen. EPINEPHrine (AUVI-Q) 0.3 mg/0.3 mL IJ SOAJ injection    Sig: Use as directed for severe allergic reaction.    Dispense:  2 Device    Refill:  1    Patient Instructions  Zyrtec 10 mg-take 1 tablet once a day if needed for runny nose Fluticasone-2 sprays per nostril once a day if needed for stuffy nose ProAir-2 puffs every 4 hours if needed for cough or wheeze. Use ProAir 2 puffs 5-15 minutes before exercise to reduce cough or wheeze Continue allergy injections every 2 weeks  Follow up with your primary care provider for your blood pressure  Continue on your other medications as listed in  your chart  Call us if you're not doing well on this treatment plan  Follow up in 6 months or sooner if needed   Return in about 6 months (around 06/23/2018), or if symptoms worsen or fail to improve.   Thank you for the opportunity to care for this patient.  Please do not hesitate to contact me with questions.  Thermon Leyland, FNP [facility]  I have provided oversight concerning Thermon Leyland' evaluation and treatment of this patient's health issues addressed during today's encounter. I agree with the assessment and therapeutic plan as outlined in the  note.   Thank you for the opportunity to care for this patient.  Please do not hesitate to contact me with questions.  Tonette Bihari, M.D.  [facility] [address] [place], [place] [address] [phone]

## 2018-01-07 ENCOUNTER — Ambulatory Visit (INDEPENDENT_AMBULATORY_CARE_PROVIDER_SITE_OTHER): Payer: BLUE CROSS/BLUE SHIELD

## 2018-01-07 DIAGNOSIS — J309 Allergic rhinitis, unspecified: Secondary | ICD-10-CM

## 2018-01-23 ENCOUNTER — Ambulatory Visit (INDEPENDENT_AMBULATORY_CARE_PROVIDER_SITE_OTHER): Payer: BLUE CROSS/BLUE SHIELD

## 2018-01-23 DIAGNOSIS — J309 Allergic rhinitis, unspecified: Secondary | ICD-10-CM

## 2018-01-27 DIAGNOSIS — J3089 Other allergic rhinitis: Secondary | ICD-10-CM | POA: Diagnosis not present

## 2018-02-05 ENCOUNTER — Ambulatory Visit (INDEPENDENT_AMBULATORY_CARE_PROVIDER_SITE_OTHER): Payer: BLUE CROSS/BLUE SHIELD

## 2018-02-05 DIAGNOSIS — J309 Allergic rhinitis, unspecified: Secondary | ICD-10-CM

## 2018-02-19 ENCOUNTER — Ambulatory Visit (INDEPENDENT_AMBULATORY_CARE_PROVIDER_SITE_OTHER): Payer: BLUE CROSS/BLUE SHIELD

## 2018-02-19 DIAGNOSIS — J309 Allergic rhinitis, unspecified: Secondary | ICD-10-CM

## 2018-02-26 ENCOUNTER — Ambulatory Visit (INDEPENDENT_AMBULATORY_CARE_PROVIDER_SITE_OTHER): Payer: BLUE CROSS/BLUE SHIELD

## 2018-02-26 DIAGNOSIS — J309 Allergic rhinitis, unspecified: Secondary | ICD-10-CM | POA: Diagnosis not present

## 2018-03-03 ENCOUNTER — Ambulatory Visit (INDEPENDENT_AMBULATORY_CARE_PROVIDER_SITE_OTHER): Payer: BLUE CROSS/BLUE SHIELD | Admitting: *Deleted

## 2018-03-03 DIAGNOSIS — J309 Allergic rhinitis, unspecified: Secondary | ICD-10-CM | POA: Diagnosis not present

## 2018-03-12 ENCOUNTER — Ambulatory Visit (INDEPENDENT_AMBULATORY_CARE_PROVIDER_SITE_OTHER): Payer: BLUE CROSS/BLUE SHIELD

## 2018-03-12 DIAGNOSIS — J309 Allergic rhinitis, unspecified: Secondary | ICD-10-CM | POA: Diagnosis not present

## 2018-03-18 ENCOUNTER — Ambulatory Visit (INDEPENDENT_AMBULATORY_CARE_PROVIDER_SITE_OTHER): Payer: BLUE CROSS/BLUE SHIELD

## 2018-03-18 DIAGNOSIS — J309 Allergic rhinitis, unspecified: Secondary | ICD-10-CM

## 2018-03-31 ENCOUNTER — Ambulatory Visit (INDEPENDENT_AMBULATORY_CARE_PROVIDER_SITE_OTHER): Payer: BLUE CROSS/BLUE SHIELD

## 2018-03-31 DIAGNOSIS — J309 Allergic rhinitis, unspecified: Secondary | ICD-10-CM

## 2018-04-24 ENCOUNTER — Ambulatory Visit (INDEPENDENT_AMBULATORY_CARE_PROVIDER_SITE_OTHER): Payer: BLUE CROSS/BLUE SHIELD | Admitting: *Deleted

## 2018-04-24 DIAGNOSIS — J309 Allergic rhinitis, unspecified: Secondary | ICD-10-CM | POA: Diagnosis not present

## 2018-05-13 ENCOUNTER — Ambulatory Visit (INDEPENDENT_AMBULATORY_CARE_PROVIDER_SITE_OTHER): Payer: BLUE CROSS/BLUE SHIELD

## 2018-05-13 DIAGNOSIS — J309 Allergic rhinitis, unspecified: Secondary | ICD-10-CM | POA: Diagnosis not present

## 2018-05-15 NOTE — Progress Notes (Signed)
VIALS EXP 05-20-2019 

## 2018-05-22 DIAGNOSIS — J3089 Other allergic rhinitis: Secondary | ICD-10-CM

## 2018-05-27 ENCOUNTER — Ambulatory Visit (INDEPENDENT_AMBULATORY_CARE_PROVIDER_SITE_OTHER): Payer: BLUE CROSS/BLUE SHIELD

## 2018-05-27 DIAGNOSIS — J309 Allergic rhinitis, unspecified: Secondary | ICD-10-CM | POA: Diagnosis not present

## 2018-06-09 ENCOUNTER — Ambulatory Visit (INDEPENDENT_AMBULATORY_CARE_PROVIDER_SITE_OTHER): Payer: BLUE CROSS/BLUE SHIELD

## 2018-06-09 DIAGNOSIS — J309 Allergic rhinitis, unspecified: Secondary | ICD-10-CM | POA: Diagnosis not present

## 2018-06-26 ENCOUNTER — Ambulatory Visit (INDEPENDENT_AMBULATORY_CARE_PROVIDER_SITE_OTHER): Payer: BLUE CROSS/BLUE SHIELD

## 2018-06-26 DIAGNOSIS — J309 Allergic rhinitis, unspecified: Secondary | ICD-10-CM | POA: Diagnosis not present

## 2018-07-03 ENCOUNTER — Ambulatory Visit (INDEPENDENT_AMBULATORY_CARE_PROVIDER_SITE_OTHER): Payer: BLUE CROSS/BLUE SHIELD

## 2018-07-03 DIAGNOSIS — J309 Allergic rhinitis, unspecified: Secondary | ICD-10-CM | POA: Diagnosis not present

## 2018-07-14 ENCOUNTER — Ambulatory Visit (INDEPENDENT_AMBULATORY_CARE_PROVIDER_SITE_OTHER): Payer: BLUE CROSS/BLUE SHIELD

## 2018-07-14 DIAGNOSIS — J309 Allergic rhinitis, unspecified: Secondary | ICD-10-CM | POA: Diagnosis not present

## 2018-07-22 ENCOUNTER — Ambulatory Visit (INDEPENDENT_AMBULATORY_CARE_PROVIDER_SITE_OTHER): Payer: BLUE CROSS/BLUE SHIELD

## 2018-07-22 DIAGNOSIS — J309 Allergic rhinitis, unspecified: Secondary | ICD-10-CM | POA: Diagnosis not present

## 2018-07-31 ENCOUNTER — Ambulatory Visit (INDEPENDENT_AMBULATORY_CARE_PROVIDER_SITE_OTHER): Payer: BLUE CROSS/BLUE SHIELD

## 2018-07-31 DIAGNOSIS — J309 Allergic rhinitis, unspecified: Secondary | ICD-10-CM | POA: Diagnosis not present

## 2018-08-11 ENCOUNTER — Ambulatory Visit (INDEPENDENT_AMBULATORY_CARE_PROVIDER_SITE_OTHER): Payer: BLUE CROSS/BLUE SHIELD

## 2018-08-11 DIAGNOSIS — J309 Allergic rhinitis, unspecified: Secondary | ICD-10-CM | POA: Diagnosis not present

## 2018-08-28 ENCOUNTER — Ambulatory Visit (INDEPENDENT_AMBULATORY_CARE_PROVIDER_SITE_OTHER): Payer: BLUE CROSS/BLUE SHIELD

## 2018-08-28 DIAGNOSIS — J309 Allergic rhinitis, unspecified: Secondary | ICD-10-CM

## 2018-09-09 ENCOUNTER — Ambulatory Visit (INDEPENDENT_AMBULATORY_CARE_PROVIDER_SITE_OTHER): Payer: BLUE CROSS/BLUE SHIELD

## 2018-09-09 DIAGNOSIS — J309 Allergic rhinitis, unspecified: Secondary | ICD-10-CM

## 2018-09-23 ENCOUNTER — Ambulatory Visit (INDEPENDENT_AMBULATORY_CARE_PROVIDER_SITE_OTHER): Payer: BLUE CROSS/BLUE SHIELD

## 2018-09-23 DIAGNOSIS — J309 Allergic rhinitis, unspecified: Secondary | ICD-10-CM | POA: Diagnosis not present

## 2018-09-29 NOTE — Progress Notes (Signed)
Vials exp 09-29-2019

## 2018-10-02 DIAGNOSIS — J3089 Other allergic rhinitis: Secondary | ICD-10-CM | POA: Diagnosis not present

## 2018-10-15 ENCOUNTER — Ambulatory Visit (INDEPENDENT_AMBULATORY_CARE_PROVIDER_SITE_OTHER): Payer: BC Managed Care – PPO

## 2018-10-15 DIAGNOSIS — J309 Allergic rhinitis, unspecified: Secondary | ICD-10-CM

## 2018-10-28 ENCOUNTER — Ambulatory Visit (INDEPENDENT_AMBULATORY_CARE_PROVIDER_SITE_OTHER): Payer: BC Managed Care – PPO

## 2018-10-28 DIAGNOSIS — J309 Allergic rhinitis, unspecified: Secondary | ICD-10-CM | POA: Diagnosis not present

## 2018-11-03 ENCOUNTER — Ambulatory Visit (INDEPENDENT_AMBULATORY_CARE_PROVIDER_SITE_OTHER): Payer: BC Managed Care – PPO

## 2018-11-03 DIAGNOSIS — J309 Allergic rhinitis, unspecified: Secondary | ICD-10-CM | POA: Diagnosis not present

## 2018-11-06 DIAGNOSIS — G4733 Obstructive sleep apnea (adult) (pediatric): Secondary | ICD-10-CM | POA: Insufficient documentation

## 2018-11-10 ENCOUNTER — Ambulatory Visit (INDEPENDENT_AMBULATORY_CARE_PROVIDER_SITE_OTHER): Payer: BC Managed Care – PPO

## 2018-11-10 DIAGNOSIS — J309 Allergic rhinitis, unspecified: Secondary | ICD-10-CM

## 2018-12-01 ENCOUNTER — Ambulatory Visit (INDEPENDENT_AMBULATORY_CARE_PROVIDER_SITE_OTHER): Payer: BC Managed Care – PPO

## 2018-12-01 DIAGNOSIS — J309 Allergic rhinitis, unspecified: Secondary | ICD-10-CM

## 2018-12-15 ENCOUNTER — Ambulatory Visit (INDEPENDENT_AMBULATORY_CARE_PROVIDER_SITE_OTHER): Payer: BC Managed Care – PPO

## 2018-12-15 ENCOUNTER — Ambulatory Visit: Payer: Self-pay

## 2018-12-15 DIAGNOSIS — J309 Allergic rhinitis, unspecified: Secondary | ICD-10-CM

## 2018-12-25 DIAGNOSIS — J302 Other seasonal allergic rhinitis: Secondary | ICD-10-CM | POA: Insufficient documentation

## 2018-12-25 NOTE — Progress Notes (Signed)
Follow Up Note  RE: Kenneth Jefferson MRN: [id number] DOB: 1970/12/28 Date of Office Visit: 12/26/2018  Referring provider: Lester [place]on., MD Primary care provider: Lester [place]., MD  Chief Complaint: Allergic Rhinitis  and Asthma  History of Present Illness: I had the pleasure of seeing Kenneth Jefferson for a follow up visit at the [facility] on 12/26/2018. He is a 48 y.o. male, who is being followed for allergic rhinitis and asthma. Today he is here for regular follow up visit. His previous allergy office visit was on 12/23/2017 with Dr. Beaulah Dinning.   Allergic rhinitis: Patient has been on allergy injections since 2015 and tolerating it well. He has had 90% improvement in his symptoms. Takes allegra or zyrtec daily.  Takes Flonase as needed. No nosebleeds.   Asthma: Denies any SOB, coughing, wheezing, chest tightness, nocturnal awakenings, ER/urgent care visits or prednisone use since the last visit. Uses albuterol as a preventative prior to heavy exertion with good benefit.  Assessment and Plan: [name] is a 48 y.o. male with: Seasonal and perennial allergic rhinitis Past history - started AIT 10/19/2013, maintenance reached 09/21/2014 with W/M/Cr + G/T/C/D. Interim history - noted 90% improvement in symptoms since on AIT. Getting it every 2-3 weeks with no issues. Takes zyrtec or allegra daily and Flonase prn with good benefit.   Continue allergy injections every 2-3 weeks. Patient remembers better when it's every 2 weeks.   May use over the counter antihistamines such as Zyrtec (cetirizine), Claritin (loratadine), Allegra (fexofenadine), or Xyzal (levocetirizine) daily as needed.  Make sure you are NOT taking anything with the decongestant as it can increase your blood pressure.  Patient prefers getting repeat skin testing before stopping the injections. Will repeat skin testing at next visit.   Continue Flonase 2 sprays per nostril for nasal congestion.   Mild intermittent asthma without complication Uses albuterol prior to heavy exertion with good benefit.  Today's spirometry was normal. Act score 23.  May use albuterol rescue inhaler 2 puffs or nebulizer every 4 to 6 hours as needed for shortness of breath, chest tightness, coughing, and wheezing. May use albuterol rescue inhaler 2 puffs 5 to 15 minutes prior to strenuous physical activities. Monitor frequency of use.   Return in about 1 year (around 12/26/2019) for Skin testing.  Meds ordered this encounter  Medications  . EPINEPHrine (AUVI-Q) 0.3 mg/0.3 mL IJ SOAJ injection    Sig: Use as directed for severe allergic reaction.    Dispense:  2 each    Refill:  1  . fluticasone (FLONASE) 50 MCG/ACT nasal spray    Sig: 2 sprays per nostril if needed for stuffy nose.    Dispense:  16 g    Refill:  11  . albuterol (PROAIR HFA) 108 (90 Base) MCG/ACT inhaler    Sig: Inhale 2 puffs into the lungs every 4 (four) hours as needed for wheezing or shortness of breath.    Dispense:  18 g    Refill:  1    Please keep rx on file. Pt. Will call when needed.   Diagnostics: Spirometry:  Tracings reviewed. His effort: Good reproducible efforts. FVC: 4.79L FEV1: 4.33L, 115% predicted FEV1/FVC ratio: 90% Interpretation: Spirometry consistent with normal pattern.  Please see scanned spirometry results for details.  Medication List:  Current Outpatient Medications  Medication Sig Dispense Refill  . albuterol (PROAIR HFA) 108 (90 Base) MCG/ACT inhaler Inhale 2 puffs into the lungs every 4 (four) hours as  needed for wheezing or shortness of breath. 18 g 1  . amLODipine (NORVASC) 5 MG tablet Take by mouth.    Marland Kitchen amphetamine-dextroamphetamine (ADDERALL) 30 MG tablet     . atorvastatin (LIPITOR) 20 MG tablet TAKE 1 TABLET BY MOUTH EVERY NIGHT AT BEDTIME    . buPROPion (WELLBUTRIN XL) 150 MG 24 hr tablet Take 150 mg by mouth.    . cetirizine (ZYRTEC) 10 MG tablet Take 10 mg by mouth.    .  EPINEPHrine (AUVI-Q) 0.3 mg/0.3 mL IJ SOAJ injection Use as directed for severe allergic reaction. 2 each 1  . erythromycin ophthalmic ointment APPLY TO SURGICAL INCISIONS 2-3 TIMES PER DAY FOR 2 WEEKS    . fexofenadine (ALLEGRA) 180 MG tablet Take 180 mg by mouth.    . fluticasone (FLONASE) 50 MCG/ACT nasal spray 2 sprays per nostril if needed for stuffy nose. 16 g 11  . lisinopril (PRINIVIL,ZESTRIL) 10 MG tablet   11  . Multiple Vitamin (MULTI-VITAMINS) TABS Take by mouth.    . NON FORMULARY Inject as directed. ALLERGEN IMMUNOTHERAPY    . sildenafil (VIAGRA) 100 MG tablet TAKE 1/2 TO 1 TABLET BY MOUTH 30 MINUTES PRIOR TO INTERCOURSE AS NEEDED    . tamsulosin (FLOMAX) 0.4 MG CAPS capsule TAKE 1 CAPSULE BY MOUTH DAILY    . valACYclovir (VALTREX) 1000 MG tablet Take 2,000 mg by mouth.     No current facility-administered medications for this visit.    Allergies: No Known Allergies I reviewed his past medical history, social history, family history, and environmental history and no significant changes have been reported from previous visit on 12/23/2017.  Review of Systems  Constitutional: Negative for appetite change, chills, fever and unexpected weight change.  HENT: Negative for congestion and rhinorrhea.   Eyes: Negative for itching.  Respiratory: Negative for cough, chest tightness, shortness of breath and wheezing.   Gastrointestinal: Negative for abdominal pain.  Skin: Negative for rash.  Allergic/Immunologic: Positive for environmental allergies.  Neurological: Negative for headaches.   Objective: BP 132/72 (BP Location: Left Arm, Patient Position: Sitting, Cuff Size: Normal)   Pulse 78   Temp (!) 97 F (36.1 C) (Temporal)   Resp 16   Ht 5\' 8"  (1.727 m)   Wt 210 lb 5.1 oz (95.4 kg)   SpO2 96%   BMI 31.98 kg/m  Body mass index is 31.98 kg/m. Physical Exam  Constitutional: He is oriented to person, place, and time. He appears well-developed and well-nourished.  HENT:   Head: Normocephalic and atraumatic.  Right Ear: External ear normal.  Left Ear: External ear normal.  Nose: Nose normal.  Mouth/Throat: Oropharynx is clear and moist.  Eyes: Conjunctivae and EOM are normal.  Neck: Neck supple.  Cardiovascular: Normal rate, regular rhythm and normal heart sounds. Exam reveals no gallop and no friction rub.  No murmur heard. Pulmonary/Chest: Effort normal and breath sounds normal. He has no wheezes. He has no rales.  Neurological: He is alert and oriented to person, place, and time.  Skin: Skin is warm. No rash noted.  Psychiatric: He has a normal mood and affect. His behavior is normal.  Nursing note and vitals reviewed.  Previous notes and tests were reviewed. The plan was reviewed with the patient/family, and all questions/concerned were addressed.  It was my pleasure to see Kenneth Jefferson today and participate in his care. Please feel free to contact me with any questions or concerns.  Sincerely,  Rexene Alberts, DO Allergy & Immunology  [facility] [place] office: [phone] [facility] office: [place] office: [phone]

## 2018-12-26 ENCOUNTER — Other Ambulatory Visit: Payer: Self-pay

## 2018-12-26 ENCOUNTER — Ambulatory Visit: Payer: BC Managed Care – PPO | Admitting: Allergy

## 2018-12-26 ENCOUNTER — Encounter: Payer: Self-pay | Admitting: Allergy

## 2018-12-26 VITALS — BP 132/72 | HR 78 | Temp 97.0°F | Resp 16 | Ht 68.0 in | Wt 210.3 lb

## 2018-12-26 DIAGNOSIS — J309 Allergic rhinitis, unspecified: Secondary | ICD-10-CM | POA: Diagnosis not present

## 2018-12-26 DIAGNOSIS — J3089 Other allergic rhinitis: Secondary | ICD-10-CM

## 2018-12-26 DIAGNOSIS — J4520 Mild intermittent asthma, uncomplicated: Secondary | ICD-10-CM

## 2018-12-26 DIAGNOSIS — J302 Other seasonal allergic rhinitis: Secondary | ICD-10-CM | POA: Diagnosis not present

## 2018-12-26 MED ORDER — ALBUTEROL SULFATE HFA 108 (90 BASE) MCG/ACT IN AERS: 2.0000 | INHALATION_SPRAY | RESPIRATORY_TRACT | 1 refills | Status: DC | PRN

## 2018-12-26 MED ORDER — EPINEPHRINE 0.3 MG/0.3ML IJ SOAJ: INTRAMUSCULAR | 1 refills | Status: DC

## 2018-12-26 MED ORDER — FLUTICASONE PROPIONATE 50 MCG/ACT NA SUSP: NASAL | 11 refills | Status: DC

## 2018-12-26 NOTE — Assessment & Plan Note (Addendum)
Past history - started AIT 10/19/2013, maintenance reached 09/21/2014 with W/M/Cr + G/T/C/D. Interim history - noted 90% improvement in symptoms since on AIT. Getting it every 2-3 weeks with no issues. Takes zyrtec or allegra daily and Flonase prn with good benefit.   Continue allergy injections every 2-3 weeks. Patient remembers better when it's every 2 weeks.   May use over the counter antihistamines such as Zyrtec (cetirizine), Claritin (loratadine), Allegra (fexofenadine), or Xyzal (levocetirizine) daily as needed.  Make sure you are NOT taking anything with the decongestant as it can increase your blood pressure.  Patient prefers getting repeat skin testing before stopping the injections. Will repeat skin testing at next visit.   Continue Flonase 2 sprays per nostril for nasal congestion.

## 2018-12-26 NOTE — Patient Instructions (Addendum)
Continue allergy injections every 2-3 weeks. May use over the counter antihistamines such as Zyrtec (cetirizine), Claritin (loratadine), Allegra (fexofenadine), or Xyzal (levocetirizine) daily as needed. Make sure you are NOT taking anything with the decongestant as it can increase your blood pressure. Continue Flonase 2 sprays per nostril for nasal congestion.  Asthma: May use albuterol rescue inhaler 2 puffs or nebulizer every 4 to 6 hours as needed for shortness of breath, chest tightness, coughing, and wheezing. May use albuterol rescue inhaler 2 puffs 5 to 15 minutes prior to strenuous physical activities. Monitor frequency of use.   Follow up in 1 year.  Hold your allergy medication for 3 days before for skin testing. Check with your insurance regarding coverage.

## 2018-12-26 NOTE — Assessment & Plan Note (Signed)
Uses albuterol prior to heavy exertion with good benefit.  Today's spirometry was normal. Act score 23.  May use albuterol rescue inhaler 2 puffs or nebulizer every 4 to 6 hours as needed for shortness of breath, chest tightness, coughing, and wheezing. May use albuterol rescue inhaler 2 puffs 5 to 15 minutes prior to strenuous physical activities. Monitor frequency of use.

## 2019-01-06 ENCOUNTER — Other Ambulatory Visit: Payer: Self-pay | Admitting: Family Medicine

## 2019-01-16 ENCOUNTER — Ambulatory Visit (INDEPENDENT_AMBULATORY_CARE_PROVIDER_SITE_OTHER): Payer: BC Managed Care – PPO

## 2019-01-16 DIAGNOSIS — J309 Allergic rhinitis, unspecified: Secondary | ICD-10-CM | POA: Diagnosis not present

## 2019-01-21 ENCOUNTER — Ambulatory Visit (INDEPENDENT_AMBULATORY_CARE_PROVIDER_SITE_OTHER): Payer: BC Managed Care – PPO

## 2019-01-21 DIAGNOSIS — J309 Allergic rhinitis, unspecified: Secondary | ICD-10-CM | POA: Diagnosis not present

## 2019-01-26 ENCOUNTER — Ambulatory Visit (INDEPENDENT_AMBULATORY_CARE_PROVIDER_SITE_OTHER): Payer: BC Managed Care – PPO

## 2019-01-26 DIAGNOSIS — J309 Allergic rhinitis, unspecified: Secondary | ICD-10-CM | POA: Diagnosis not present

## 2019-02-09 ENCOUNTER — Ambulatory Visit (INDEPENDENT_AMBULATORY_CARE_PROVIDER_SITE_OTHER): Payer: BC Managed Care – PPO

## 2019-02-09 DIAGNOSIS — J309 Allergic rhinitis, unspecified: Secondary | ICD-10-CM

## 2019-03-04 ENCOUNTER — Ambulatory Visit (INDEPENDENT_AMBULATORY_CARE_PROVIDER_SITE_OTHER): Payer: BC Managed Care – PPO

## 2019-03-04 DIAGNOSIS — J309 Allergic rhinitis, unspecified: Secondary | ICD-10-CM

## 2019-03-09 DIAGNOSIS — J3089 Other allergic rhinitis: Secondary | ICD-10-CM | POA: Diagnosis not present

## 2019-03-09 NOTE — Progress Notes (Signed)
Vials exp 03-08-20

## 2019-03-17 ENCOUNTER — Ambulatory Visit (INDEPENDENT_AMBULATORY_CARE_PROVIDER_SITE_OTHER): Payer: BC Managed Care – PPO

## 2019-03-17 DIAGNOSIS — J309 Allergic rhinitis, unspecified: Secondary | ICD-10-CM

## 2019-04-01 ENCOUNTER — Ambulatory Visit (INDEPENDENT_AMBULATORY_CARE_PROVIDER_SITE_OTHER): Payer: BC Managed Care – PPO

## 2019-04-01 DIAGNOSIS — J309 Allergic rhinitis, unspecified: Secondary | ICD-10-CM

## 2019-04-07 ENCOUNTER — Ambulatory Visit (INDEPENDENT_AMBULATORY_CARE_PROVIDER_SITE_OTHER): Payer: BC Managed Care – PPO

## 2019-04-07 DIAGNOSIS — J309 Allergic rhinitis, unspecified: Secondary | ICD-10-CM | POA: Diagnosis not present

## 2019-04-16 ENCOUNTER — Ambulatory Visit (INDEPENDENT_AMBULATORY_CARE_PROVIDER_SITE_OTHER): Payer: BC Managed Care – PPO

## 2019-04-16 DIAGNOSIS — J309 Allergic rhinitis, unspecified: Secondary | ICD-10-CM | POA: Diagnosis not present

## 2019-04-24 ENCOUNTER — Ambulatory Visit (INDEPENDENT_AMBULATORY_CARE_PROVIDER_SITE_OTHER): Payer: BC Managed Care – PPO

## 2019-04-24 DIAGNOSIS — J309 Allergic rhinitis, unspecified: Secondary | ICD-10-CM

## 2019-04-27 ENCOUNTER — Ambulatory Visit (INDEPENDENT_AMBULATORY_CARE_PROVIDER_SITE_OTHER): Payer: BC Managed Care – PPO

## 2019-04-27 DIAGNOSIS — J309 Allergic rhinitis, unspecified: Secondary | ICD-10-CM | POA: Diagnosis not present

## 2019-05-19 ENCOUNTER — Ambulatory Visit (INDEPENDENT_AMBULATORY_CARE_PROVIDER_SITE_OTHER): Payer: BC Managed Care – PPO

## 2019-05-19 DIAGNOSIS — J309 Allergic rhinitis, unspecified: Secondary | ICD-10-CM

## 2019-06-01 ENCOUNTER — Ambulatory Visit (INDEPENDENT_AMBULATORY_CARE_PROVIDER_SITE_OTHER): Payer: BC Managed Care – PPO

## 2019-06-01 DIAGNOSIS — J309 Allergic rhinitis, unspecified: Secondary | ICD-10-CM

## 2019-06-09 ENCOUNTER — Ambulatory Visit (INDEPENDENT_AMBULATORY_CARE_PROVIDER_SITE_OTHER): Payer: BC Managed Care – PPO

## 2019-06-09 DIAGNOSIS — J309 Allergic rhinitis, unspecified: Secondary | ICD-10-CM

## 2019-06-09 NOTE — Progress Notes (Signed)
Vials exp 06-09-20 

## 2019-06-10 DIAGNOSIS — J3089 Other allergic rhinitis: Secondary | ICD-10-CM | POA: Diagnosis not present

## 2019-06-24 ENCOUNTER — Ambulatory Visit (INDEPENDENT_AMBULATORY_CARE_PROVIDER_SITE_OTHER): Payer: BC Managed Care – PPO

## 2019-06-24 DIAGNOSIS — J309 Allergic rhinitis, unspecified: Secondary | ICD-10-CM | POA: Diagnosis not present

## 2019-07-14 ENCOUNTER — Ambulatory Visit (INDEPENDENT_AMBULATORY_CARE_PROVIDER_SITE_OTHER): Payer: BC Managed Care – PPO

## 2019-07-14 DIAGNOSIS — J309 Allergic rhinitis, unspecified: Secondary | ICD-10-CM | POA: Diagnosis not present

## 2019-08-04 ENCOUNTER — Ambulatory Visit (INDEPENDENT_AMBULATORY_CARE_PROVIDER_SITE_OTHER): Payer: BC Managed Care – PPO

## 2019-08-04 DIAGNOSIS — J309 Allergic rhinitis, unspecified: Secondary | ICD-10-CM

## 2019-08-18 ENCOUNTER — Ambulatory Visit (INDEPENDENT_AMBULATORY_CARE_PROVIDER_SITE_OTHER): Payer: BC Managed Care – PPO

## 2019-08-18 DIAGNOSIS — J309 Allergic rhinitis, unspecified: Secondary | ICD-10-CM | POA: Diagnosis not present

## 2019-08-25 ENCOUNTER — Ambulatory Visit (INDEPENDENT_AMBULATORY_CARE_PROVIDER_SITE_OTHER): Payer: BC Managed Care – PPO

## 2019-08-25 DIAGNOSIS — J309 Allergic rhinitis, unspecified: Secondary | ICD-10-CM

## 2019-09-03 ENCOUNTER — Ambulatory Visit (INDEPENDENT_AMBULATORY_CARE_PROVIDER_SITE_OTHER): Payer: BC Managed Care – PPO

## 2019-09-03 DIAGNOSIS — J309 Allergic rhinitis, unspecified: Secondary | ICD-10-CM | POA: Diagnosis not present

## 2019-09-09 ENCOUNTER — Ambulatory Visit (INDEPENDENT_AMBULATORY_CARE_PROVIDER_SITE_OTHER): Payer: BC Managed Care – PPO | Admitting: *Deleted

## 2019-09-09 DIAGNOSIS — J309 Allergic rhinitis, unspecified: Secondary | ICD-10-CM | POA: Diagnosis not present

## 2019-09-14 ENCOUNTER — Ambulatory Visit (INDEPENDENT_AMBULATORY_CARE_PROVIDER_SITE_OTHER): Payer: BC Managed Care – PPO

## 2019-09-14 DIAGNOSIS — J309 Allergic rhinitis, unspecified: Secondary | ICD-10-CM | POA: Diagnosis not present

## 2019-09-24 ENCOUNTER — Ambulatory Visit (INDEPENDENT_AMBULATORY_CARE_PROVIDER_SITE_OTHER): Payer: BC Managed Care – PPO

## 2019-09-24 DIAGNOSIS — J309 Allergic rhinitis, unspecified: Secondary | ICD-10-CM

## 2019-10-12 ENCOUNTER — Ambulatory Visit (INDEPENDENT_AMBULATORY_CARE_PROVIDER_SITE_OTHER): Payer: BC Managed Care – PPO

## 2019-10-12 DIAGNOSIS — J309 Allergic rhinitis, unspecified: Secondary | ICD-10-CM

## 2019-10-26 ENCOUNTER — Ambulatory Visit (INDEPENDENT_AMBULATORY_CARE_PROVIDER_SITE_OTHER): Payer: BC Managed Care – PPO

## 2019-10-26 DIAGNOSIS — J309 Allergic rhinitis, unspecified: Secondary | ICD-10-CM

## 2019-11-09 ENCOUNTER — Ambulatory Visit (INDEPENDENT_AMBULATORY_CARE_PROVIDER_SITE_OTHER): Payer: BC Managed Care – PPO

## 2019-11-09 DIAGNOSIS — J309 Allergic rhinitis, unspecified: Secondary | ICD-10-CM | POA: Diagnosis not present

## 2019-11-17 NOTE — Progress Notes (Signed)
VIALS EXP 11-16-20 

## 2019-11-20 NOTE — Progress Notes (Signed)
EXP 11/19/20 

## 2019-11-26 DIAGNOSIS — J3089 Other allergic rhinitis: Secondary | ICD-10-CM

## 2019-11-27 ENCOUNTER — Ambulatory Visit (INDEPENDENT_AMBULATORY_CARE_PROVIDER_SITE_OTHER): Payer: BC Managed Care – PPO

## 2019-11-27 DIAGNOSIS — J309 Allergic rhinitis, unspecified: Secondary | ICD-10-CM

## 2019-12-14 ENCOUNTER — Ambulatory Visit (INDEPENDENT_AMBULATORY_CARE_PROVIDER_SITE_OTHER): Payer: BC Managed Care – PPO

## 2019-12-14 DIAGNOSIS — J309 Allergic rhinitis, unspecified: Secondary | ICD-10-CM

## 2019-12-23 ENCOUNTER — Ambulatory Visit (INDEPENDENT_AMBULATORY_CARE_PROVIDER_SITE_OTHER): Payer: BC Managed Care – PPO

## 2019-12-23 DIAGNOSIS — J309 Allergic rhinitis, unspecified: Secondary | ICD-10-CM

## 2019-12-28 ENCOUNTER — Ambulatory Visit (INDEPENDENT_AMBULATORY_CARE_PROVIDER_SITE_OTHER): Payer: BC Managed Care – PPO | Admitting: *Deleted

## 2019-12-28 DIAGNOSIS — J309 Allergic rhinitis, unspecified: Secondary | ICD-10-CM | POA: Diagnosis not present

## 2020-01-01 HISTORY — PX: CARDIAC CATHETERIZATION: SHX172

## 2020-01-05 ENCOUNTER — Ambulatory Visit (INDEPENDENT_AMBULATORY_CARE_PROVIDER_SITE_OTHER): Payer: BC Managed Care – PPO

## 2020-01-05 DIAGNOSIS — J309 Allergic rhinitis, unspecified: Secondary | ICD-10-CM | POA: Diagnosis not present

## 2020-01-11 ENCOUNTER — Ambulatory Visit (INDEPENDENT_AMBULATORY_CARE_PROVIDER_SITE_OTHER): Payer: BC Managed Care – PPO

## 2020-01-11 DIAGNOSIS — J309 Allergic rhinitis, unspecified: Secondary | ICD-10-CM | POA: Diagnosis not present

## 2020-01-25 ENCOUNTER — Other Ambulatory Visit: Payer: Self-pay | Admitting: Allergy

## 2020-01-28 ENCOUNTER — Ambulatory Visit (INDEPENDENT_AMBULATORY_CARE_PROVIDER_SITE_OTHER): Payer: BC Managed Care – PPO

## 2020-01-28 DIAGNOSIS — J309 Allergic rhinitis, unspecified: Secondary | ICD-10-CM

## 2020-02-15 DIAGNOSIS — J3089 Other allergic rhinitis: Secondary | ICD-10-CM

## 2020-02-15 NOTE — Progress Notes (Signed)
VIALS EXP 02-14-21 °

## 2020-02-17 ENCOUNTER — Ambulatory Visit (INDEPENDENT_AMBULATORY_CARE_PROVIDER_SITE_OTHER): Payer: BC Managed Care – PPO | Admitting: *Deleted

## 2020-02-17 DIAGNOSIS — J309 Allergic rhinitis, unspecified: Secondary | ICD-10-CM

## 2020-03-02 ENCOUNTER — Ambulatory Visit (INDEPENDENT_AMBULATORY_CARE_PROVIDER_SITE_OTHER): Payer: BC Managed Care – PPO

## 2020-03-02 DIAGNOSIS — J309 Allergic rhinitis, unspecified: Secondary | ICD-10-CM | POA: Diagnosis not present

## 2020-03-17 ENCOUNTER — Ambulatory Visit (INDEPENDENT_AMBULATORY_CARE_PROVIDER_SITE_OTHER): Payer: BC Managed Care – PPO

## 2020-03-17 DIAGNOSIS — J309 Allergic rhinitis, unspecified: Secondary | ICD-10-CM | POA: Diagnosis not present

## 2020-03-29 ENCOUNTER — Ambulatory Visit (INDEPENDENT_AMBULATORY_CARE_PROVIDER_SITE_OTHER): Payer: BC Managed Care – PPO

## 2020-03-29 DIAGNOSIS — J309 Allergic rhinitis, unspecified: Secondary | ICD-10-CM

## 2020-04-11 ENCOUNTER — Ambulatory Visit (INDEPENDENT_AMBULATORY_CARE_PROVIDER_SITE_OTHER): Payer: BC Managed Care – PPO

## 2020-04-11 DIAGNOSIS — J309 Allergic rhinitis, unspecified: Secondary | ICD-10-CM | POA: Diagnosis not present

## 2020-04-22 ENCOUNTER — Ambulatory Visit (INDEPENDENT_AMBULATORY_CARE_PROVIDER_SITE_OTHER): Payer: BC Managed Care – PPO

## 2020-04-22 DIAGNOSIS — J309 Allergic rhinitis, unspecified: Secondary | ICD-10-CM

## 2020-04-29 ENCOUNTER — Ambulatory Visit (INDEPENDENT_AMBULATORY_CARE_PROVIDER_SITE_OTHER): Payer: BC Managed Care – PPO | Admitting: *Deleted

## 2020-04-29 DIAGNOSIS — J309 Allergic rhinitis, unspecified: Secondary | ICD-10-CM

## 2020-05-04 ENCOUNTER — Ambulatory Visit (INDEPENDENT_AMBULATORY_CARE_PROVIDER_SITE_OTHER): Payer: BC Managed Care – PPO

## 2020-05-04 DIAGNOSIS — J309 Allergic rhinitis, unspecified: Secondary | ICD-10-CM | POA: Diagnosis not present

## 2020-05-13 ENCOUNTER — Ambulatory Visit (INDEPENDENT_AMBULATORY_CARE_PROVIDER_SITE_OTHER): Payer: BC Managed Care – PPO

## 2020-05-13 DIAGNOSIS — J309 Allergic rhinitis, unspecified: Secondary | ICD-10-CM

## 2020-05-26 ENCOUNTER — Ambulatory Visit (INDEPENDENT_AMBULATORY_CARE_PROVIDER_SITE_OTHER): Payer: BC Managed Care – PPO

## 2020-05-26 DIAGNOSIS — J309 Allergic rhinitis, unspecified: Secondary | ICD-10-CM

## 2020-06-10 ENCOUNTER — Ambulatory Visit (INDEPENDENT_AMBULATORY_CARE_PROVIDER_SITE_OTHER): Payer: BC Managed Care – PPO

## 2020-06-10 DIAGNOSIS — J309 Allergic rhinitis, unspecified: Secondary | ICD-10-CM | POA: Diagnosis not present

## 2020-06-23 ENCOUNTER — Ambulatory Visit (INDEPENDENT_AMBULATORY_CARE_PROVIDER_SITE_OTHER): Payer: BC Managed Care – PPO

## 2020-06-23 DIAGNOSIS — J309 Allergic rhinitis, unspecified: Secondary | ICD-10-CM | POA: Diagnosis not present

## 2020-07-08 ENCOUNTER — Ambulatory Visit (INDEPENDENT_AMBULATORY_CARE_PROVIDER_SITE_OTHER): Payer: BC Managed Care – PPO

## 2020-07-08 DIAGNOSIS — J309 Allergic rhinitis, unspecified: Secondary | ICD-10-CM | POA: Diagnosis not present

## 2020-07-13 NOTE — Progress Notes (Signed)
VIALS EXP 07-13-21 

## 2020-07-14 DIAGNOSIS — J3089 Other allergic rhinitis: Secondary | ICD-10-CM | POA: Diagnosis not present

## 2020-07-22 ENCOUNTER — Ambulatory Visit (INDEPENDENT_AMBULATORY_CARE_PROVIDER_SITE_OTHER): Payer: BC Managed Care – PPO | Admitting: *Deleted

## 2020-07-22 DIAGNOSIS — J309 Allergic rhinitis, unspecified: Secondary | ICD-10-CM

## 2020-08-01 ENCOUNTER — Ambulatory Visit (INDEPENDENT_AMBULATORY_CARE_PROVIDER_SITE_OTHER): Payer: BC Managed Care – PPO

## 2020-08-01 DIAGNOSIS — J309 Allergic rhinitis, unspecified: Secondary | ICD-10-CM

## 2020-08-10 ENCOUNTER — Ambulatory Visit (INDEPENDENT_AMBULATORY_CARE_PROVIDER_SITE_OTHER): Payer: BC Managed Care – PPO

## 2020-08-10 DIAGNOSIS — J309 Allergic rhinitis, unspecified: Secondary | ICD-10-CM | POA: Diagnosis not present

## 2020-08-17 ENCOUNTER — Ambulatory Visit (INDEPENDENT_AMBULATORY_CARE_PROVIDER_SITE_OTHER): Payer: BC Managed Care – PPO

## 2020-08-17 DIAGNOSIS — J309 Allergic rhinitis, unspecified: Secondary | ICD-10-CM | POA: Diagnosis not present

## 2020-08-26 ENCOUNTER — Ambulatory Visit (INDEPENDENT_AMBULATORY_CARE_PROVIDER_SITE_OTHER): Payer: BC Managed Care – PPO | Admitting: *Deleted

## 2020-08-26 DIAGNOSIS — J309 Allergic rhinitis, unspecified: Secondary | ICD-10-CM

## 2020-08-31 ENCOUNTER — Ambulatory Visit (INDEPENDENT_AMBULATORY_CARE_PROVIDER_SITE_OTHER): Payer: BC Managed Care – PPO

## 2020-08-31 DIAGNOSIS — J309 Allergic rhinitis, unspecified: Secondary | ICD-10-CM

## 2020-09-19 ENCOUNTER — Ambulatory Visit (INDEPENDENT_AMBULATORY_CARE_PROVIDER_SITE_OTHER): Payer: BC Managed Care – PPO

## 2020-09-19 DIAGNOSIS — J309 Allergic rhinitis, unspecified: Secondary | ICD-10-CM | POA: Diagnosis not present

## 2020-10-06 ENCOUNTER — Ambulatory Visit (INDEPENDENT_AMBULATORY_CARE_PROVIDER_SITE_OTHER): Payer: BC Managed Care – PPO

## 2020-10-06 DIAGNOSIS — J309 Allergic rhinitis, unspecified: Secondary | ICD-10-CM

## 2020-10-06 NOTE — Progress Notes (Signed)
VIALS MADE. EXP 10-06-21 

## 2020-10-07 DIAGNOSIS — J3089 Other allergic rhinitis: Secondary | ICD-10-CM | POA: Diagnosis not present

## 2020-10-11 ENCOUNTER — Other Ambulatory Visit: Payer: Self-pay | Admitting: Allergy

## 2020-10-11 NOTE — Telephone Encounter (Signed)
Last seen September of 2020. Patient was due back for a follow up september 2021. Flonase refill has been rejected twice in the past 2 years.

## 2020-10-19 ENCOUNTER — Other Ambulatory Visit: Payer: Self-pay

## 2020-10-19 MED ORDER — FLUTICASONE PROPIONATE 50 MCG/ACT NA SUSP: NASAL | 0 refills | Status: AC

## 2020-10-20 ENCOUNTER — Ambulatory Visit (INDEPENDENT_AMBULATORY_CARE_PROVIDER_SITE_OTHER): Payer: BC Managed Care – PPO

## 2020-10-20 DIAGNOSIS — J309 Allergic rhinitis, unspecified: Secondary | ICD-10-CM | POA: Diagnosis not present

## 2020-11-03 ENCOUNTER — Ambulatory Visit (INDEPENDENT_AMBULATORY_CARE_PROVIDER_SITE_OTHER): Payer: BC Managed Care – PPO

## 2020-11-03 DIAGNOSIS — J309 Allergic rhinitis, unspecified: Secondary | ICD-10-CM

## 2020-11-14 ENCOUNTER — Ambulatory Visit (INDEPENDENT_AMBULATORY_CARE_PROVIDER_SITE_OTHER): Payer: BC Managed Care – PPO

## 2020-11-14 DIAGNOSIS — J309 Allergic rhinitis, unspecified: Secondary | ICD-10-CM | POA: Diagnosis not present

## 2020-11-18 ENCOUNTER — Ambulatory Visit: Payer: BC Managed Care – PPO | Admitting: Allergy

## 2020-11-18 ENCOUNTER — Encounter: Payer: Self-pay | Admitting: Allergy

## 2020-11-18 ENCOUNTER — Other Ambulatory Visit: Payer: Self-pay

## 2020-11-18 VITALS — BP 140/76 | HR 71 | Temp 97.7°F | Resp 21 | Ht 69.0 in | Wt 204.0 lb

## 2020-11-18 DIAGNOSIS — J302 Other seasonal allergic rhinitis: Secondary | ICD-10-CM | POA: Diagnosis not present

## 2020-11-18 DIAGNOSIS — J3089 Other allergic rhinitis: Secondary | ICD-10-CM | POA: Diagnosis not present

## 2020-11-18 DIAGNOSIS — J4520 Mild intermittent asthma, uncomplicated: Secondary | ICD-10-CM | POA: Diagnosis not present

## 2020-11-18 MED ORDER — ALBUTEROL SULFATE HFA 108 (90 BASE) MCG/ACT IN AERS: 2.0000 | INHALATION_SPRAY | RESPIRATORY_TRACT | 1 refills | Status: DC | PRN

## 2020-11-18 MED ORDER — EPINEPHRINE 0.3 MG/0.3ML IJ SOAJ: INTRAMUSCULAR | 1 refills | Status: AC

## 2020-11-18 NOTE — Assessment & Plan Note (Signed)
Uses albuterol on very rare occasions - mainly when doing yardwork.  Today's spirometry was normal.  . May use albuterol rescue inhaler 2 puffs every 4 to 6 hours as needed for shortness of breath, chest tightness, coughing, and wheezing. Monitor frequency of use.

## 2020-11-18 NOTE — Patient Instructions (Addendum)
Environmental allergies Continue environmental control measures. Only use the allergy medication if needed. Use over the counter antihistamines such as Zyrtec (cetirizine), Claritin (loratadine), Allegra (fexofenadine), or Xyzal (levocetirizine) daily as needed. May switch antihistamines every few months. See if your urine flow improves with not taking daily allergy medication. Use Flonase (fluticasone) nasal spray 1 spray per nostril twice a day as needed only for nasal congestion.  Continue allergy injections every 2-3 weeks.  Will perform skin testing in the winter to see if we can stop the injections.   Asthma: May use albuterol rescue inhaler 2 puffs every 4 to 6 hours as needed for shortness of breath, chest tightness, coughing, and wheezing. Monitor frequency of use.   Follow up for skin testing in the winter - no antihistamines for 3 days before.

## 2020-11-18 NOTE — Assessment & Plan Note (Addendum)
Past history - started AIT 10/19/2013, maintenance reached 09/21/2014 with W/M/Cr + G/T/C/D. Interim history - doing well with below regimen. Symptoms significantly improved with injections.  Continue environmental control measures.  Only use the allergy medications if needed.  Use over the counter antihistamines such as Zyrtec (cetirizine), Claritin (loratadine), Allegra (fexofenadine), or Xyzal (levocetirizine) daily as needed. May switch antihistamines every few months.  See if urine flow improves with not taking daily allergy medication. . Use Flonase (fluticasone) nasal spray 1 spray per nostril twice a day as needed only for nasal congestion.  . Continue allergy injections every 2-3 weeks.  . Will perform skin testing in the winter to see if we can stop the injections.

## 2020-11-18 NOTE — Progress Notes (Signed)
Follow Up Note  RE: Kenneth Jefferson MRN: [id number] DOB: 10-02-1970 Date of Office Visit: 11/18/2020  Referring provider: Lester [place]., MD Primary care provider: Lester [place]., MD  Chief Complaint: Allergies  History of Present Illness: I had the pleasure of seeing Kenneth Jefferson for a follow up visit at the [facility] [place] on 11/18/2020. He is a 50 y.o. male, who is being followed for allergic rhinitis on AIT and asthma. His previous allergy office visit was on 12/26/2018 with Dr. Selena Batten. Today is a regular follow up visit.  Seasonal and perennial allergic rhinitis Patient is getting AIT every 2-3 weeks and notices some increased nasal congestion/rhinorrhea if he comes in every 3 weeks. No issues with the injections and noted significant improvement in his symptoms since he started. Currently taking zyrtec or allegra daily and not sure if symptoms worsen if he forgets to take it. Takes Flonase 1 spray per nostril 2-3 times per week as needed with good benefit. No nosebleeds. No eye drop use.    Mild intermittent asthma  Denies any SOB, coughing, wheezing, chest tightness, nocturnal awakenings, ER/urgent care visits or prednisone use since the last visit. No albuterol use for the past 6 months. Sometimes has to use when working outdoors.   Got stung by yellow jacket around his right eye which caused some localized swelling. No other symptoms. Took benadryl with good benefit.   Assessment and Plan: Kenneth Jefferson is a 50 y.o. male with: Seasonal and perennial allergic rhinitis Past history - started AIT 10/19/2013, maintenance reached 09/21/2014 with W/M/Cr + G/T/C/D. Interim history - doing well with below regimen. Symptoms significantly improved with injections. Continue environmental control measures. Only use the allergy medications if needed. Use over the counter antihistamines such as Zyrtec (cetirizine), Claritin (loratadine), Allegra (fexofenadine), or Xyzal  (levocetirizine) daily as needed. May switch antihistamines every few months. See if urine flow improves with not taking daily allergy medication. Use Flonase (fluticasone) nasal spray 1 spray per nostril twice a day as needed only for nasal congestion.  Continue allergy injections every 2-3 weeks.  Will perform skin testing in the winter to see if we can stop the injections.   Mild intermittent asthma without complication Uses albuterol on very rare occasions - mainly when doing yardwork. Today's spirometry was normal.  May use albuterol rescue inhaler 2 puffs every 4 to 6 hours as needed for shortness of breath, chest tightness, coughing, and wheezing. Monitor frequency of use.   Return in about 3 months (around 02/18/2021) for Skin testing.  Meds ordered this encounter  Medications   albuterol (PROAIR HFA) 108 (90 Base) MCG/ACT inhaler    Sig: Inhale 2 puffs into the lungs every 4 (four) hours as needed for wheezing or shortness of breath (coughing fits).    Dispense:  8 g    Refill:  1   EPINEPHrine (AUVI-Q) 0.3 mg/0.3 mL IJ SOAJ injection    Sig: Use as directed for severe allergic reaction.    Dispense:  2 each    Refill:  1    [phone]    Lab Orders  No laboratory test(s) ordered today    Diagnostics: Spirometry:  Tracings reviewed. His effort: Good reproducible efforts. FVC: 3.48L FEV1: 2.94L, 79% predicted FEV1/FVC ratio: 84% Interpretation: Spirometry consistent with possible restrictive disease.  Please see scanned spirometry results for details.  Medication List:  Current Outpatient Medications  Medication Sig Dispense Refill   amphetamine-dextroamphetamine (ADDERALL) 30 MG tablet  atorvastatin (LIPITOR) 20 MG tablet TAKE 1 TABLET BY MOUTH EVERY NIGHT AT BEDTIME     cetirizine (ZYRTEC) 10 MG tablet Take 10 mg by mouth.     fluticasone (FLONASE) 50 MCG/ACT nasal spray 2 sprays per nostril if needed for stuffy nose. 16 g 0   lisinopril (ZESTRIL) 20 MG  tablet Take 20 mg by mouth daily.     Multiple Vitamin (MULTI-VITAMINS) TABS Take by mouth.     NON FORMULARY Inject as directed. ALLERGEN IMMUNOTHERAPY     sildenafil (VIAGRA) 100 MG tablet TAKE 1/2 TO 1 TABLET BY MOUTH 30 MINUTES PRIOR TO INTERCOURSE AS NEEDED     tamsulosin (FLOMAX) 0.4 MG CAPS capsule Take 0.8 mg by mouth.     valACYclovir (VALTREX) 1000 MG tablet Take 2,000 mg by mouth.     albuterol (PROAIR HFA) 108 (90 Base) MCG/ACT inhaler Inhale 2 puffs into the lungs every 4 (four) hours as needed for wheezing or shortness of breath (coughing fits). 8 g 1   EPINEPHrine (AUVI-Q) 0.3 mg/0.3 mL IJ SOAJ injection Use as directed for severe allergic reaction. 2 each 1   fexofenadine (ALLEGRA) 180 MG tablet Take 180 mg by mouth. (Patient not taking: Reported on 11/18/2020)     No current facility-administered medications for this visit.   Allergies: No Known Allergies I reviewed his past medical history, social history, family history, and environmental history and no significant changes have been reported from his previous visit.  Review of Systems  Constitutional:  Negative for appetite change, chills, fever and unexpected weight change.  HENT:  Negative for congestion and rhinorrhea.   Eyes:  Negative for itching.  Respiratory:  Negative for cough, chest tightness, shortness of breath and wheezing.   Gastrointestinal:  Negative for abdominal pain.  Skin:  Negative for rash.  Allergic/Immunologic: Positive for environmental allergies.  Neurological:  Negative for headaches.   Objective: BP 140/76   Pulse 71   Temp 97.7 F (36.5 C) (Temporal)   Resp (!) 21   Ht 5\' 9"  (1.753 m)   Wt 204 lb (92.5 kg)   SpO2 98%   BMI 30.13 kg/m  Body mass index is 30.13 kg/m. Physical Exam Vitals and nursing note reviewed.  Constitutional:      Appearance: He is well-developed.  HENT:     Head: Normocephalic and atraumatic.     Right Ear: External ear normal.     Left Ear: External ear  normal.     Nose: Nose normal.  Eyes:     Conjunctiva/sclera: Conjunctivae normal.  Cardiovascular:     Rate and Rhythm: Normal rate and regular rhythm.     Heart sounds: Normal heart sounds. No murmur heard.   No friction rub. No gallop.  Pulmonary:     Effort: Pulmonary effort is normal.     Breath sounds: Normal breath sounds. No wheezing or rales.  Musculoskeletal:     Cervical back: Neck supple.  Skin:    General: Skin is warm.     Findings: No rash.  Neurological:     Mental Status: He is alert and oriented to person, place, and time.  Psychiatric:        Behavior: Behavior normal.   Previous notes and tests were reviewed. The plan was reviewed with the patient/family, and all questions/concerned were addressed.  It was my pleasure to see Kenneth Jefferson today and participate in his care. Please feel free to contact me with any questions or concerns.  Sincerely,  Leonette Most  Selena Batten, DO Allergy & Immunology  [facility] office: [phone] [facility] office: [phone]

## 2020-11-25 DIAGNOSIS — F988 Other specified behavioral and emotional disorders with onset usually occurring in childhood and adolescence: Secondary | ICD-10-CM | POA: Insufficient documentation

## 2020-12-13 ENCOUNTER — Ambulatory Visit (INDEPENDENT_AMBULATORY_CARE_PROVIDER_SITE_OTHER): Payer: BC Managed Care – PPO

## 2020-12-13 DIAGNOSIS — J309 Allergic rhinitis, unspecified: Secondary | ICD-10-CM | POA: Diagnosis not present

## 2020-12-23 ENCOUNTER — Ambulatory Visit (INDEPENDENT_AMBULATORY_CARE_PROVIDER_SITE_OTHER): Payer: BC Managed Care – PPO

## 2020-12-23 DIAGNOSIS — J309 Allergic rhinitis, unspecified: Secondary | ICD-10-CM

## 2021-01-02 ENCOUNTER — Ambulatory Visit (INDEPENDENT_AMBULATORY_CARE_PROVIDER_SITE_OTHER): Payer: BC Managed Care – PPO

## 2021-01-02 DIAGNOSIS — J309 Allergic rhinitis, unspecified: Secondary | ICD-10-CM

## 2021-01-12 ENCOUNTER — Ambulatory Visit (INDEPENDENT_AMBULATORY_CARE_PROVIDER_SITE_OTHER): Payer: BC Managed Care – PPO

## 2021-01-12 DIAGNOSIS — J309 Allergic rhinitis, unspecified: Secondary | ICD-10-CM | POA: Diagnosis not present

## 2021-01-26 ENCOUNTER — Ambulatory Visit (INDEPENDENT_AMBULATORY_CARE_PROVIDER_SITE_OTHER): Payer: BC Managed Care – PPO

## 2021-01-26 DIAGNOSIS — J309 Allergic rhinitis, unspecified: Secondary | ICD-10-CM | POA: Diagnosis not present

## 2021-02-16 ENCOUNTER — Ambulatory Visit (INDEPENDENT_AMBULATORY_CARE_PROVIDER_SITE_OTHER): Payer: BC Managed Care – PPO

## 2021-02-16 DIAGNOSIS — J309 Allergic rhinitis, unspecified: Secondary | ICD-10-CM

## 2021-03-01 ENCOUNTER — Other Ambulatory Visit (HOSPITAL_BASED_OUTPATIENT_CLINIC_OR_DEPARTMENT_OTHER): Payer: Self-pay | Admitting: Internal Medicine

## 2021-03-02 ENCOUNTER — Telehealth (HOSPITAL_BASED_OUTPATIENT_CLINIC_OR_DEPARTMENT_OTHER): Payer: Self-pay

## 2021-03-02 ENCOUNTER — Other Ambulatory Visit (HOSPITAL_BASED_OUTPATIENT_CLINIC_OR_DEPARTMENT_OTHER): Payer: Self-pay | Admitting: Internal Medicine

## 2021-03-02 DIAGNOSIS — N182 Chronic kidney disease, stage 2 (mild): Secondary | ICD-10-CM

## 2021-03-07 ENCOUNTER — Ambulatory Visit (INDEPENDENT_AMBULATORY_CARE_PROVIDER_SITE_OTHER): Payer: BC Managed Care – PPO

## 2021-03-07 DIAGNOSIS — J309 Allergic rhinitis, unspecified: Secondary | ICD-10-CM | POA: Diagnosis not present

## 2021-03-10 ENCOUNTER — Ambulatory Visit (HOSPITAL_BASED_OUTPATIENT_CLINIC_OR_DEPARTMENT_OTHER): Payer: BC Managed Care – PPO

## 2021-03-10 ENCOUNTER — Ambulatory Visit (HOSPITAL_BASED_OUTPATIENT_CLINIC_OR_DEPARTMENT_OTHER)
Admission: RE | Admit: 2021-03-10 | Discharge: 2021-03-10 | Disposition: A | Discharge status: Home / Self Care | Payer: BC Managed Care – PPO | Source: Ambulatory Visit | Attending: Internal Medicine | Admitting: Internal Medicine

## 2021-03-10 ENCOUNTER — Other Ambulatory Visit: Payer: Self-pay

## 2021-03-10 DIAGNOSIS — N182 Chronic kidney disease, stage 2 (mild): Secondary | ICD-10-CM | POA: Insufficient documentation

## 2021-03-20 ENCOUNTER — Ambulatory Visit (INDEPENDENT_AMBULATORY_CARE_PROVIDER_SITE_OTHER): Payer: BC Managed Care – PPO

## 2021-03-20 DIAGNOSIS — J309 Allergic rhinitis, unspecified: Secondary | ICD-10-CM | POA: Diagnosis not present

## 2021-04-07 ENCOUNTER — Ambulatory Visit (INDEPENDENT_AMBULATORY_CARE_PROVIDER_SITE_OTHER): Payer: BC Managed Care – PPO

## 2021-04-07 DIAGNOSIS — J309 Allergic rhinitis, unspecified: Secondary | ICD-10-CM | POA: Diagnosis not present

## 2021-04-14 DIAGNOSIS — J3089 Other allergic rhinitis: Secondary | ICD-10-CM | POA: Diagnosis not present

## 2021-04-14 NOTE — Progress Notes (Signed)
EXP 04/14/22 °

## 2021-05-11 ENCOUNTER — Ambulatory Visit (INDEPENDENT_AMBULATORY_CARE_PROVIDER_SITE_OTHER): Payer: BC Managed Care – PPO | Admitting: *Deleted

## 2021-05-11 DIAGNOSIS — J309 Allergic rhinitis, unspecified: Secondary | ICD-10-CM

## 2021-05-31 ENCOUNTER — Other Ambulatory Visit (HOSPITAL_COMMUNITY): Payer: Self-pay | Admitting: Internal Medicine

## 2021-05-31 DIAGNOSIS — R319 Hematuria, unspecified: Secondary | ICD-10-CM

## 2021-05-31 DIAGNOSIS — R809 Proteinuria, unspecified: Secondary | ICD-10-CM

## 2021-06-05 ENCOUNTER — Ambulatory Visit (INDEPENDENT_AMBULATORY_CARE_PROVIDER_SITE_OTHER): Payer: BC Managed Care – PPO

## 2021-06-05 DIAGNOSIS — J309 Allergic rhinitis, unspecified: Secondary | ICD-10-CM | POA: Diagnosis not present

## 2021-06-12 ENCOUNTER — Other Ambulatory Visit (HOSPITAL_COMMUNITY): Payer: Self-pay | Admitting: Physician Assistant

## 2021-06-12 ENCOUNTER — Other Ambulatory Visit: Payer: Self-pay | Admitting: Radiology

## 2021-06-13 ENCOUNTER — Ambulatory Visit (HOSPITAL_COMMUNITY)
Admission: RE | Admit: 2021-06-13 | Discharge: 2021-06-13 | Disposition: A | Discharge status: Home / Self Care | Payer: BC Managed Care – PPO | Source: Ambulatory Visit | Attending: Internal Medicine | Admitting: Internal Medicine

## 2021-06-13 ENCOUNTER — Encounter (HOSPITAL_COMMUNITY): Payer: Self-pay

## 2021-06-13 ENCOUNTER — Other Ambulatory Visit: Payer: Self-pay

## 2021-06-13 DIAGNOSIS — R319 Hematuria, unspecified: Secondary | ICD-10-CM

## 2021-06-13 DIAGNOSIS — Z841 Family history of disorders of kidney and ureter: Secondary | ICD-10-CM | POA: Diagnosis not present

## 2021-06-13 DIAGNOSIS — R809 Proteinuria, unspecified: Secondary | ICD-10-CM | POA: Diagnosis present

## 2021-06-13 DIAGNOSIS — I10 Essential (primary) hypertension: Secondary | ICD-10-CM | POA: Diagnosis not present

## 2021-06-13 DIAGNOSIS — N269 Renal sclerosis, unspecified: Secondary | ICD-10-CM | POA: Diagnosis not present

## 2021-06-13 DIAGNOSIS — E785 Hyperlipidemia, unspecified: Secondary | ICD-10-CM | POA: Insufficient documentation

## 2021-06-13 DIAGNOSIS — N400 Enlarged prostate without lower urinary tract symptoms: Secondary | ICD-10-CM | POA: Diagnosis not present

## 2021-06-13 LAB — CBC
HCT: 43.7 % (ref 39.0–52.0)
Hemoglobin: 15.1 g/dL (ref 13.0–17.0)
MCH: 30.3 pg (ref 26.0–34.0)
MCHC: 34.6 g/dL (ref 30.0–36.0)
MCV: 87.6 fL (ref 80.0–100.0)
Platelets: 142 10*3/uL — ABNORMAL LOW (ref 150–400)
RBC: 4.99 MIL/uL (ref 4.22–5.81)
RDW: 12.7 % (ref 11.5–15.5)
WBC: 5.4 10*3/uL (ref 4.0–10.5)
nRBC: 0 % (ref 0.0–0.2)

## 2021-06-13 LAB — PROTIME-INR
INR: 1 (ref 0.8–1.2)
Prothrombin Time: 12.7 seconds (ref 11.4–15.2)

## 2021-06-13 MED ORDER — SODIUM CHLORIDE 0.9 % IV SOLN: INTRAVENOUS | Status: DC

## 2021-06-13 MED ORDER — FENTANYL CITRATE (PF) 100 MCG/2ML IJ SOLN
INTRAMUSCULAR | Status: AC | PRN
  Administered 2021-06-13 (×2): 50 ug via INTRAVENOUS

## 2021-06-13 MED ORDER — FENTANYL CITRATE (PF) 100 MCG/2ML IJ SOLN
INTRAMUSCULAR | Status: AC
  Filled 2021-06-13: qty 2

## 2021-06-13 MED ORDER — MIDAZOLAM HCL 2 MG/2ML IJ SOLN
INTRAMUSCULAR | Status: AC
  Filled 2021-06-13: qty 2

## 2021-06-13 MED ORDER — CHLORTHALIDONE 25 MG PO TABS
12.5000 mg | ORAL_TABLET | Freq: Once | ORAL | Status: AC
  Administered 2021-06-13: 12.5 mg via ORAL
  Filled 2021-06-13: qty 0.5

## 2021-06-13 MED ORDER — HYDROCODONE-ACETAMINOPHEN 5-325 MG PO TABS: 1.0000 | ORAL_TABLET | ORAL | Status: DC | PRN

## 2021-06-13 MED ORDER — LISINOPRIL 20 MG PO TABS
40.0000 mg | ORAL_TABLET | Freq: Once | ORAL | Status: AC
  Administered 2021-06-13: 40 mg via ORAL
  Filled 2021-06-13: qty 2

## 2021-06-13 MED ORDER — MIDAZOLAM HCL 2 MG/2ML IJ SOLN
INTRAMUSCULAR | Status: AC | PRN
  Administered 2021-06-13 (×2): 1 mg via INTRAVENOUS

## 2021-06-13 MED ORDER — LIDOCAINE HCL (PF) 1 % IJ SOLN
INTRAMUSCULAR | Status: AC
  Filled 2021-06-13: qty 30

## 2021-06-13 NOTE — H&P (Signed)
? ?Chief Complaint: ?Patient was seen in consultation today for random renal biopsy at the request of Jefferson,Kenneth J ? ?Referring Physician(s): ?Jefferson,Kenneth J ? ?Supervising Physician: Oley BalmHassell, Kenneth ? ?[facility]: [facility] ? ?History of Present Illness: ?Kenneth Jefferson is a 51 y.o. male  ? 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 ?Scheduled now for random renal biopsy per Dr Valentino NosePeeples request ? ? ?Past Medical History:  ?Diagnosis Date  ? Alport syndrome   ? Erectile dysfunction   ? Hyperlipemia   ? ? ?Past Surgical History:  ?Procedure Laterality Date  ? CARDIAC CATHETERIZATION  01/2020  ? FIBULA FRACTURE SURGERY    ? ? ?Allergies: ?Patient has no known allergies. ? ?Medications: ?Prior to Admission medications   ?Medication Sig Start Date End Date Taking? Authorizing Provider  ?amphetamine-dextroamphetamine (ADDERALL) 30 MG tablet Take 30 mg by mouth See admin instructions. Take 30 mg daily, may take a second 30 mg dose as needed for focusing 08/13/16  Yes [provider]  ?atorvastatin (LIPITOR) 20 MG tablet Take 20 mg by mouth daily. 02/17/16  Yes [provider]  ?cetirizine (ZYRTEC) 10 MG tablet Take 10 mg by mouth daily.   Yes [provider]  ?chlorthalidone (HYGROTON) 25 MG tablet Take 12.5 mg by mouth daily.   Yes [provider]  ?ibuprofen (ADVIL) 200 MG tablet Take 400 mg by mouth every 6 (six) hours as needed for moderate pain.   Yes [provider]  ?lisinopril (ZESTRIL) 40 MG tablet Take 40 mg by mouth daily.   Yes [provider]  ?Multiple Vitamin (MULTI-VITAMINS) TABS Take 1 tablet by mouth daily.   Yes [provider]  ?NON FORMULARY Inject as directed. ALLERGEN IMMUNOTHERAPY   Yes [provider]  ?Olopatadine HCl (PATADAY OP) Place 1 drop into both eyes daily as needed (allergies).   Yes [provider]  ?sildenafil (VIAGRA) 100 MG tablet Take 50  mg by mouth as needed for erectile dysfunction. 10/06/13  Yes [provider]  ?tamsulosin (FLOMAX) 0.4 MG CAPS capsule Take 0.8 mg by mouth daily. 02/17/16  Yes [provider]  ?albuterol (PROAIR HFA) 108 (90 Base) MCG/ACT inhaler Inhale 2 puffs into the lungs every 4 (four) hours as needed for wheezing or shortness of breath (coughing fits). ?Patient not taking: Reported on 06/12/2021 11/18/20   Ellamae SiaKim, Yoon M, DO  ?EPINEPHrine (AUVI-Q) 0.3 mg/0.3 mL IJ SOAJ injection Use as directed for severe allergic reaction. 11/18/20   Ellamae SiaKim, Yoon M, DO  ?fluticasone (FLONASE) 50 MCG/ACT nasal spray 2 sprays per nostril if needed for stuffy nose. ?Patient taking differently: Place 2 sprays into both nostrils daily as needed for allergies. 10/19/20   Ellamae SiaKim, Yoon M, DO  ?valACYclovir (VALTREX) 1000 MG tablet Take 2,000 mg by mouth daily as needed (outbreak). 02/17/16   [provider]  ?  ? ?Family History  ?Problem Relation Age of Onset  ? Allergic rhinitis Neg Hx   ? Angioedema Neg Hx   ? Asthma Neg Hx   ? Eczema Neg Hx   ? Immunodeficiency Neg Hx   ? Urticaria Neg Hx   ? ? ?Social History  ? ?Socioeconomic History  ? Marital status: Married  ?  Spouse name: Not on file  ? Number of children: Not on file  ? Years of education: Not on file  ? Highest education level: Not on file  ?Occupational History  ? Not on file  ?Tobacco Use  ?  Smoking status: Former  ?  Packs/day: 1.00  ?  Years: 20.00  ?  Pack years: 20.00  ?  Types: Cigarettes  ?  Quit date: 03/15/2008  ?  Years since quitting: 13.2  ? Smokeless tobacco: Former  ?  Types: Chew  ?Vaping Use  ? Vaping Use: Some days  ? Start date: 04/27/2015  ?Substance and Sexual Activity  ? Alcohol use: No  ? Drug use: No  ? Sexual activity: Yes  ?Other Topics Concern  ? Not on file  ?Social History Narrative  ? Not on file  ? ?Social Determinants of Health  ? ?Financial Resource Strain: Not on file  ?Food Insecurity: Not on file  ?Transportation Needs: Not on file   ?Physical Activity: Not on file  ?Stress: Not on file  ?Social Connections: Not on file  ? ? ?Review of Systems: A 12 point ROS discussed and pertinent positives are indicated in the HPI above.  All other systems are negative. ? ?Review of Systems  ?Constitutional:  Negative for activity change, fatigue and fever.  ?Respiratory:  Negative for cough and shortness of breath.   ?Cardiovascular:  Negative for chest pain.  ?Gastrointestinal:  Negative for nausea and vomiting.  ?Genitourinary:  Positive for hematuria.  ?Musculoskeletal:  Negative for back pain.  ?Neurological:  Negative for weakness.  ?Psychiatric/Behavioral:  Negative for behavioral problems and confusion.   ? ?Vital Signs: ?BP 127/86   Pulse 73   Temp 98 ?F (36.7 ?C) (Oral)   Resp 18   Ht 5\' 9"  (1.753 m)   Wt 210 lb (95.3 kg)   SpO2 100%   BMI 31.01 kg/m?  ? ?Physical Exam ?Vitals reviewed.  ?HENT:  ?   Mouth/Throat:  ?   Mouth: Mucous membranes are moist.  ?Cardiovascular:  ?   Rate and Rhythm: Normal rate and regular rhythm.  ?   Heart sounds: Normal heart sounds.  ?Pulmonary:  ?   Effort: Pulmonary effort is normal.  ?   Breath sounds: Normal breath sounds.  ?Abdominal:  ?   Palpations: Abdomen is soft.  ?Musculoskeletal:     ?   General: Normal range of motion.  ?Skin: ?   General: Skin is warm.  ?Neurological:  ?   Mental Status: He is alert and oriented to person, place, and time.  ?Psychiatric:     ?   Behavior: Behavior normal.  ? ? ?Imaging: ?No results found. ? ?Labs: ? ?CBC: ?No results for input(s): WBC, HGB, HCT, PLT in the last 8760 hours. ? ?COAGS: ?No results for input(s): INR, APTT in the last 8760 hours. ? ?BMP: ?No results for input(s): NA, K, CL, CO2, GLUCOSE, BUN, CALCIUM, CREATININE, GFRNONAA, GFRAA in the last 8760 hours. ? ?Invalid input(s): CMP ? ?LIVER FUNCTION TESTS: ?No results for input(s): BILITOT, AST, ALT, ALKPHOS, PROT, ALBUMIN in the last 8760 hours. ? ?TUMOR MARKERS: ?No results for input(s): AFPTM, CEA, CA199,  CHROMGRNA in the last 8760 hours. ? ?Assessment and Plan: ? ?Hematuria and proteinuria ?Family hx Alport syndrome ?Scheduled for random renal biopsy per Dr request ?Risks and benefits of random renal biopsy was discussed with the patient and/or patient's family including, but not limited to bleeding, infection, damage to adjacent structures or low yield requiring additional tests. ? ?All of the questions were answered and there is agreement to proceed. ?Consent signed and in chart.  ? ? ?Thank you for this interesting consult.  I greatly enjoyed meeting Kenneth Jefferson and look forward  to participating in their care.  A copy of this report was sent to the requesting provider on this date. ? ?Electronically Signed: ?Kenneth Leu, PA-C ?06/13/2021, 7:12 AM ? ? ?I spent a total of  30 Minutes   in face to face in clinical consultation, greater than 50% of which was counseling/coordinating care for random renal biopsy  ?

## 2021-06-13 NOTE — Procedures (Signed)
?  Procedure: US guided core biopsy LLP renal   ?EBL:   minimal ?Complications:  none immediate ? ?See full dictation in [facility]. ? ?D. Oley Balm MD ?Main # [phone] ?Pager  [phone] ?Mobile [phone] ?  ? ?

## 2021-06-14 ENCOUNTER — Ambulatory Visit (INDEPENDENT_AMBULATORY_CARE_PROVIDER_SITE_OTHER): Payer: BC Managed Care – PPO

## 2021-06-14 DIAGNOSIS — J309 Allergic rhinitis, unspecified: Secondary | ICD-10-CM | POA: Diagnosis not present

## 2021-06-19 ENCOUNTER — Encounter (HOSPITAL_COMMUNITY): Payer: Self-pay

## 2021-06-19 LAB — SURGICAL PATHOLOGY

## 2021-07-03 ENCOUNTER — Ambulatory Visit (INDEPENDENT_AMBULATORY_CARE_PROVIDER_SITE_OTHER): Payer: BC Managed Care – PPO

## 2021-07-03 DIAGNOSIS — J309 Allergic rhinitis, unspecified: Secondary | ICD-10-CM

## 2021-07-10 ENCOUNTER — Other Ambulatory Visit: Payer: Self-pay | Admitting: Allergy

## 2021-07-13 ENCOUNTER — Ambulatory Visit (INDEPENDENT_AMBULATORY_CARE_PROVIDER_SITE_OTHER): Payer: BC Managed Care – PPO

## 2021-07-13 DIAGNOSIS — J309 Allergic rhinitis, unspecified: Secondary | ICD-10-CM

## 2021-07-17 ENCOUNTER — Ambulatory Visit (INDEPENDENT_AMBULATORY_CARE_PROVIDER_SITE_OTHER): Payer: BC Managed Care – PPO

## 2021-07-17 DIAGNOSIS — J309 Allergic rhinitis, unspecified: Secondary | ICD-10-CM | POA: Diagnosis not present

## 2021-07-25 ENCOUNTER — Ambulatory Visit (INDEPENDENT_AMBULATORY_CARE_PROVIDER_SITE_OTHER): Payer: BC Managed Care – PPO

## 2021-07-25 DIAGNOSIS — J309 Allergic rhinitis, unspecified: Secondary | ICD-10-CM

## 2021-08-21 ENCOUNTER — Ambulatory Visit (INDEPENDENT_AMBULATORY_CARE_PROVIDER_SITE_OTHER): Payer: BC Managed Care – PPO

## 2021-08-21 DIAGNOSIS — J309 Allergic rhinitis, unspecified: Secondary | ICD-10-CM | POA: Diagnosis not present

## 2021-09-20 DIAGNOSIS — J3081 Allergic rhinitis due to animal (cat) (dog) hair and dander: Secondary | ICD-10-CM | POA: Diagnosis not present

## 2021-09-20 NOTE — Progress Notes (Signed)
EXP 09/21/22 

## 2021-10-10 ENCOUNTER — Ambulatory Visit (INDEPENDENT_AMBULATORY_CARE_PROVIDER_SITE_OTHER): Payer: BC Managed Care – PPO

## 2021-10-10 DIAGNOSIS — J309 Allergic rhinitis, unspecified: Secondary | ICD-10-CM

## 2021-10-19 ENCOUNTER — Ambulatory Visit (INDEPENDENT_AMBULATORY_CARE_PROVIDER_SITE_OTHER): Payer: BC Managed Care – PPO

## 2021-10-19 DIAGNOSIS — J309 Allergic rhinitis, unspecified: Secondary | ICD-10-CM | POA: Diagnosis not present

## 2021-11-06 ENCOUNTER — Ambulatory Visit (INDEPENDENT_AMBULATORY_CARE_PROVIDER_SITE_OTHER): Payer: BC Managed Care – PPO

## 2021-11-06 DIAGNOSIS — J309 Allergic rhinitis, unspecified: Secondary | ICD-10-CM

## 2021-11-17 ENCOUNTER — Ambulatory Visit (INDEPENDENT_AMBULATORY_CARE_PROVIDER_SITE_OTHER): Payer: BC Managed Care – PPO | Admitting: *Deleted

## 2021-11-17 DIAGNOSIS — J309 Allergic rhinitis, unspecified: Secondary | ICD-10-CM | POA: Diagnosis not present

## 2021-11-24 ENCOUNTER — Ambulatory Visit (INDEPENDENT_AMBULATORY_CARE_PROVIDER_SITE_OTHER): Payer: BC Managed Care – PPO | Admitting: *Deleted

## 2021-11-24 DIAGNOSIS — J309 Allergic rhinitis, unspecified: Secondary | ICD-10-CM

## 2021-12-05 ENCOUNTER — Ambulatory Visit (INDEPENDENT_AMBULATORY_CARE_PROVIDER_SITE_OTHER): Payer: BC Managed Care – PPO | Admitting: *Deleted

## 2021-12-05 DIAGNOSIS — J309 Allergic rhinitis, unspecified: Secondary | ICD-10-CM

## 2021-12-26 ENCOUNTER — Ambulatory Visit (INDEPENDENT_AMBULATORY_CARE_PROVIDER_SITE_OTHER): Payer: BC Managed Care – PPO

## 2021-12-26 DIAGNOSIS — J309 Allergic rhinitis, unspecified: Secondary | ICD-10-CM

## 2022-01-04 ENCOUNTER — Ambulatory Visit (INDEPENDENT_AMBULATORY_CARE_PROVIDER_SITE_OTHER): Payer: BC Managed Care – PPO

## 2022-01-04 DIAGNOSIS — J309 Allergic rhinitis, unspecified: Secondary | ICD-10-CM

## 2022-01-08 DIAGNOSIS — J438 Other emphysema: Secondary | ICD-10-CM | POA: Insufficient documentation

## 2022-01-23 ENCOUNTER — Ambulatory Visit (INDEPENDENT_AMBULATORY_CARE_PROVIDER_SITE_OTHER): Payer: BC Managed Care – PPO

## 2022-01-23 DIAGNOSIS — J309 Allergic rhinitis, unspecified: Secondary | ICD-10-CM | POA: Diagnosis not present

## 2022-03-05 ENCOUNTER — Ambulatory Visit (INDEPENDENT_AMBULATORY_CARE_PROVIDER_SITE_OTHER): Payer: BC Managed Care – PPO

## 2022-03-05 DIAGNOSIS — J309 Allergic rhinitis, unspecified: Secondary | ICD-10-CM | POA: Diagnosis not present

## 2022-03-12 ENCOUNTER — Ambulatory Visit (INDEPENDENT_AMBULATORY_CARE_PROVIDER_SITE_OTHER): Payer: BC Managed Care – PPO

## 2022-03-12 DIAGNOSIS — J309 Allergic rhinitis, unspecified: Secondary | ICD-10-CM

## 2022-03-21 ENCOUNTER — Ambulatory Visit (INDEPENDENT_AMBULATORY_CARE_PROVIDER_SITE_OTHER): Payer: BC Managed Care – PPO

## 2022-03-21 DIAGNOSIS — J309 Allergic rhinitis, unspecified: Secondary | ICD-10-CM

## 2022-04-20 ENCOUNTER — Ambulatory Visit (INDEPENDENT_AMBULATORY_CARE_PROVIDER_SITE_OTHER): Payer: BC Managed Care – PPO

## 2022-04-20 DIAGNOSIS — J309 Allergic rhinitis, unspecified: Secondary | ICD-10-CM

## 2022-04-26 ENCOUNTER — Ambulatory Visit (INDEPENDENT_AMBULATORY_CARE_PROVIDER_SITE_OTHER): Payer: BC Managed Care – PPO

## 2022-04-26 DIAGNOSIS — J309 Allergic rhinitis, unspecified: Secondary | ICD-10-CM | POA: Diagnosis not present

## 2022-05-02 ENCOUNTER — Ambulatory Visit (INDEPENDENT_AMBULATORY_CARE_PROVIDER_SITE_OTHER): Payer: BC Managed Care – PPO | Admitting: *Deleted

## 2022-05-02 DIAGNOSIS — J309 Allergic rhinitis, unspecified: Secondary | ICD-10-CM | POA: Diagnosis not present

## 2022-09-14 IMAGING — US US BIOPSY
1 series · 14 of 14 positions shown · non-contrast
Comparison: Ultrasound 03/10/2021

CLINICAL DATA: Proteinuria, hematuria

EXAM:
ULTRASOUND GUIDED RENAL CORE BIOPSY
TECHNIQUE: Survey ultrasound was performed and an appropriate skin entry site
was localized. Site was marked, prepped with Betadine, draped in
usual sterile fashion, infiltrated locally with 1% lidocaine.

[Series 1: us biopsy (kidney) · 14 of 14 slices shown]
[im 1/14]
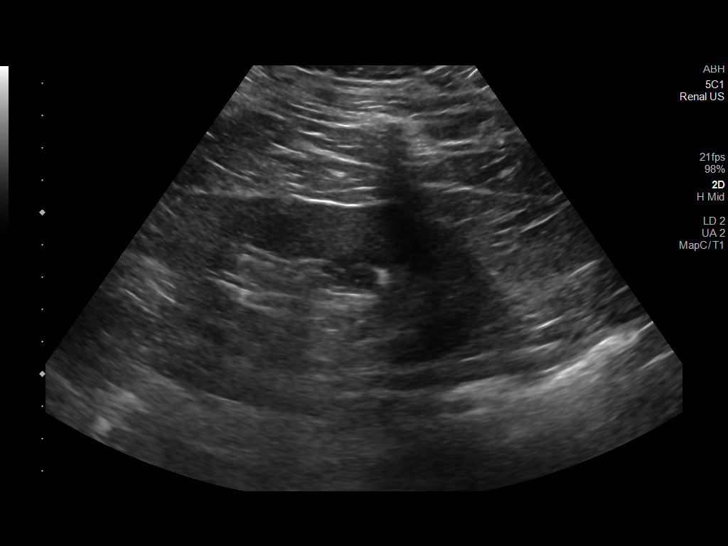
[im 2/14]
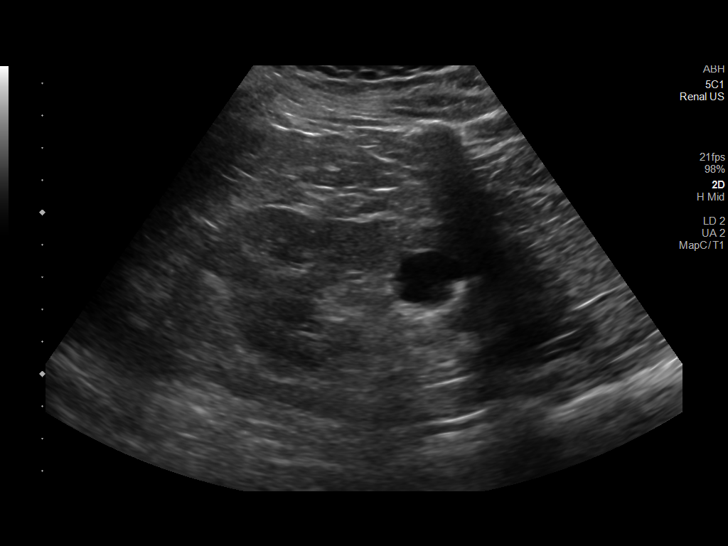
[im 3/14]
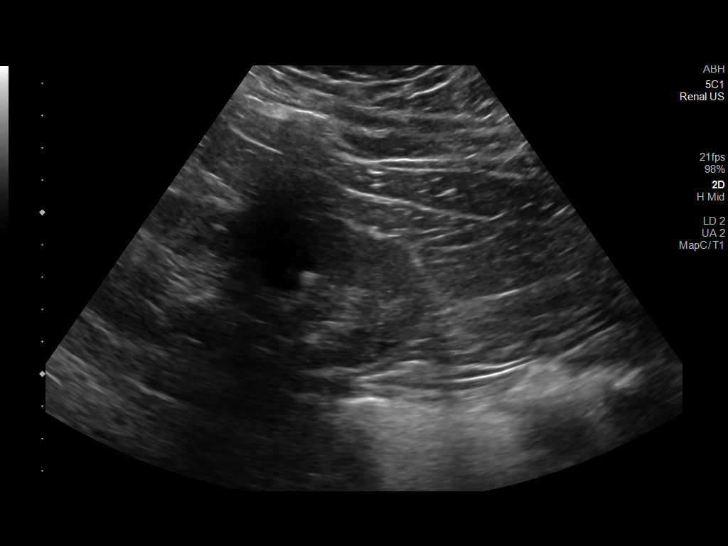
[im 4/14]
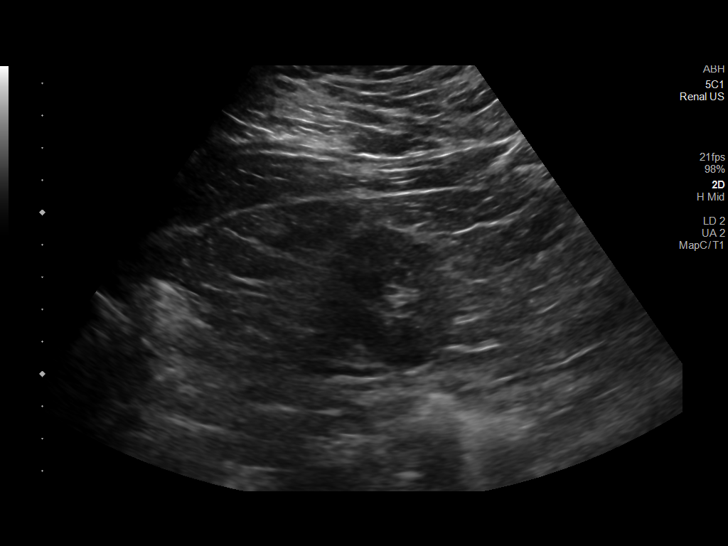
[im 5/14]
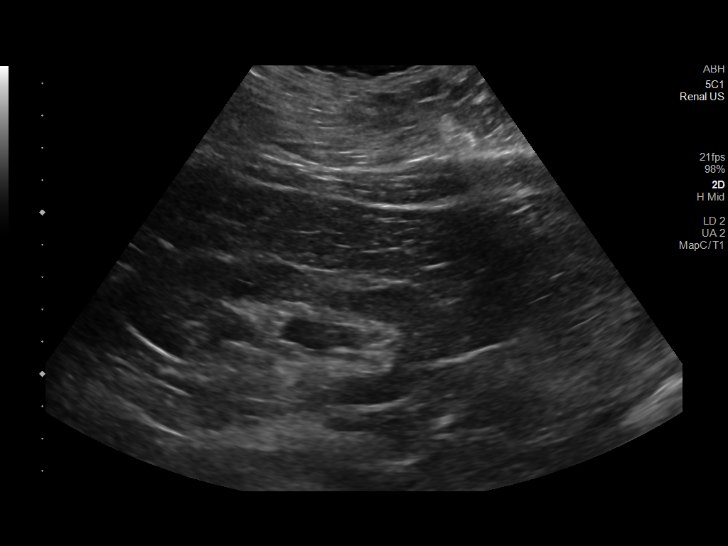
[im 6/14]
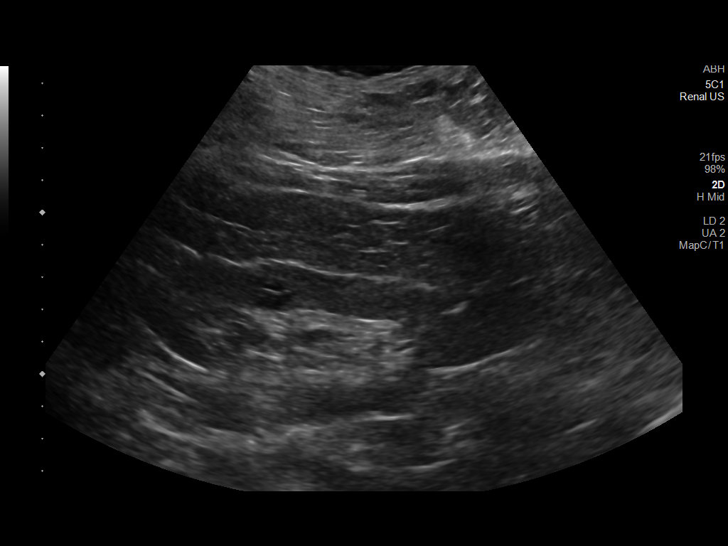
[im 7/14]
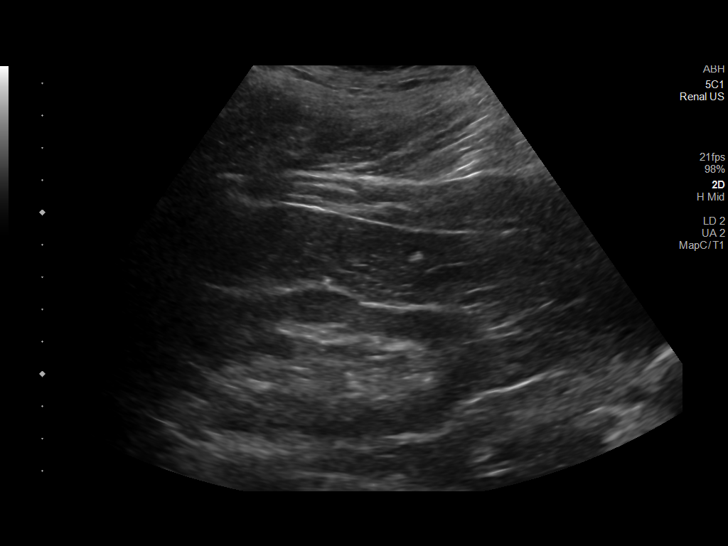
[im 8/14]
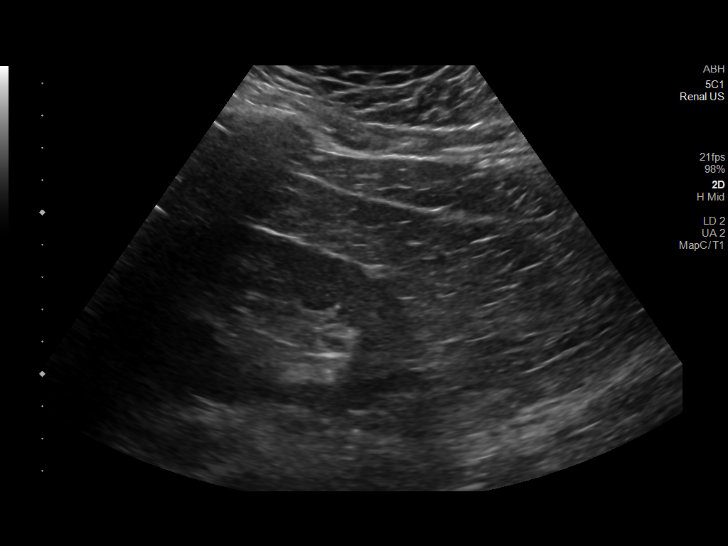
[im 9/14]
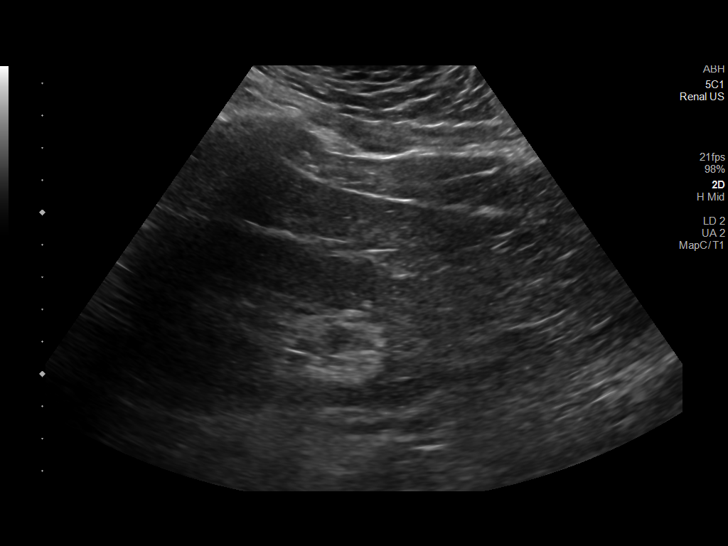
[im 10/14]
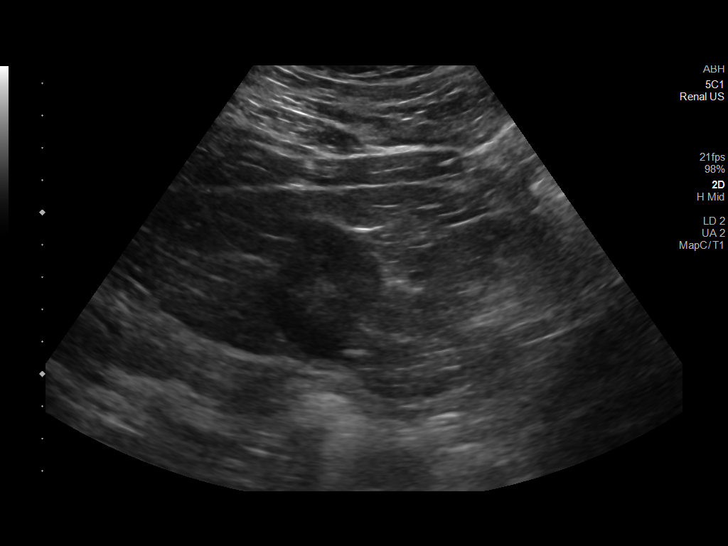
[im 11/14]
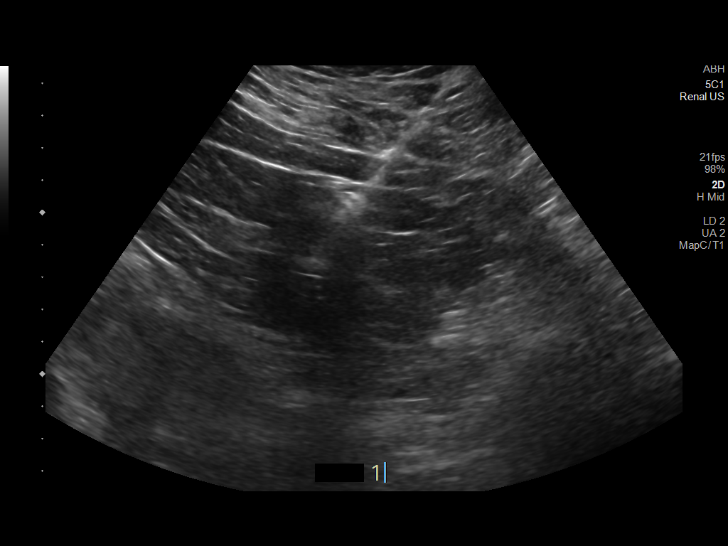
[im 12/14]
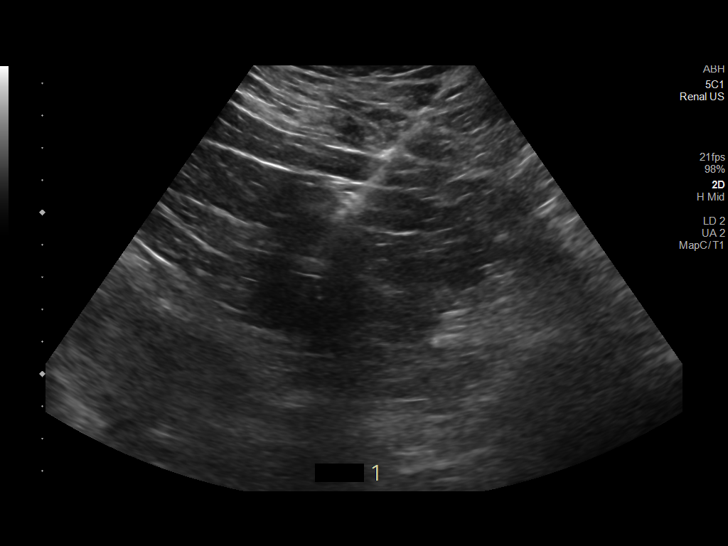
[im 13/14]
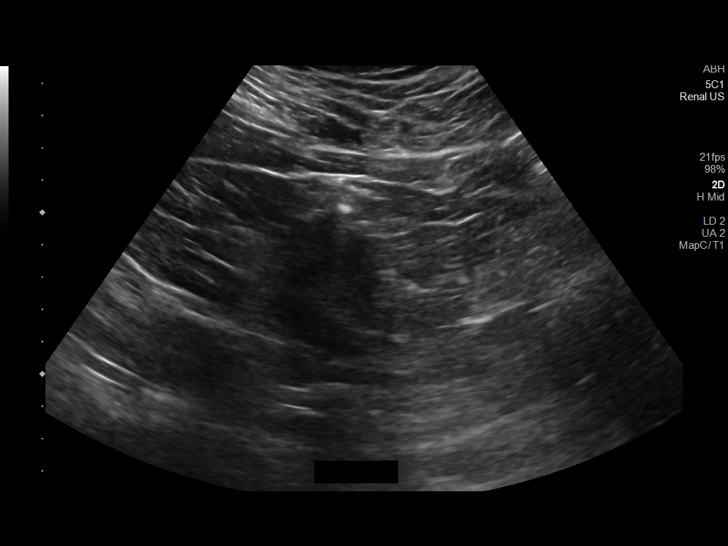
[im 14/14]
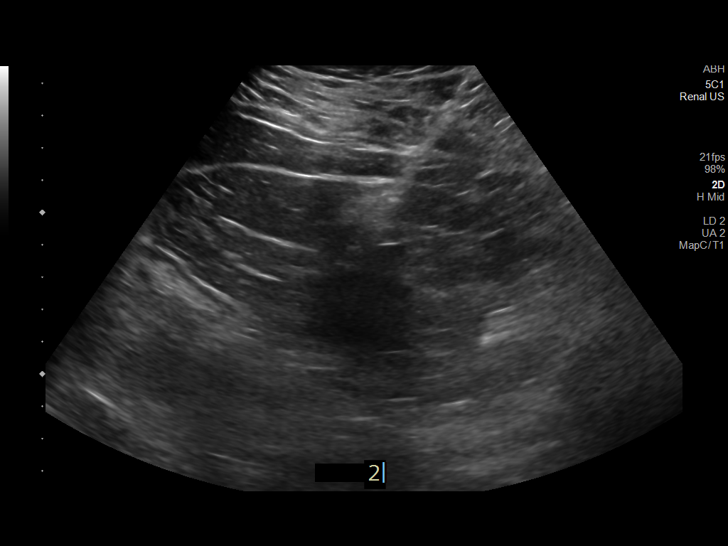

[14 of 14 positions shown; findings below may reference images not displayed]

Intravenous Fentanyl 277mcg and Versed 2mg were administered as
conscious sedation during continuous monitoring of the patient's
level of consciousness and physiological / cardiorespiratory status
by the radiology RN, with a total moderate sedation time of 10
minutes.

Under real time ultrasound guidance, a 15 gauge trocar needle was
advanced to the margin of the lower pole of the left kidney for 2
coaxial 16 gauge core biopsy needle passes. The core samples were
submitted to pathology. The patient tolerated procedure well.

Complications: None.
IMPRESSION: 1. Technically successful ultrasound-guided core renal biopsy

## 2023-09-10 ENCOUNTER — Ambulatory Visit (INDEPENDENT_AMBULATORY_CARE_PROVIDER_SITE_OTHER)

## 2023-09-10 ENCOUNTER — Encounter: Payer: Self-pay | Admitting: Podiatry

## 2023-09-10 ENCOUNTER — Ambulatory Visit: Admitting: Podiatry

## 2023-09-10 DIAGNOSIS — M722 Plantar fascial fibromatosis: Secondary | ICD-10-CM

## 2023-09-10 NOTE — Progress Notes (Signed)
  Chief Complaint  Patient presents with   Plantar Fibroma    Rm5 /right foot plantar fibroma/2 months/painful with walking and pressure/not diabetic/no treatment.   HPI: 53 y.o. male presenting today with c/o pain in the bottom of the right arch.  Patient states there are lumps in his arch.  Denies trauma.  Aggravated with weightbearing activities  Past Medical History:  Diagnosis Date   Alport syndrome    Erectile dysfunction    Hyperlipemia    Past Surgical History:  Procedure Laterality Date   CARDIAC CATHETERIZATION  01/2020   FIBULA FRACTURE SURGERY     No Known Allergies   Physical Exam: General: The patient is alert and oriented x3 in no acute distress.  Dermatology:  No ecchymosis, erythema, or edema bilateral.  No open lesions.    Vascular: Palpable pedal pulses bilaterally. Capillary refill within normal limits.  No appreciable edema.    Neurological: Epicritic sensation is intact  Musculoskeletal Exam:  There are 2 semifirm soft tissue masses within the medial plantar fascial band of the right foot in the central one third aspect of the plantar fascia.  There is pain on palpation of the masses.  No palpable gaps within the plantar fascia.  When palpated, these feel no larger than 1 cm in diameter x 2.  Radiographic Exam (right foot, 3 weightbearing views, 09/10/2023):  Normal osseous mineralization. Joint spaces preserved.  No fractures noted.  Assessment/Plan of Care: 1. Plantar fascial fibromatosis of right foot    DG FOOT COMPLETE RIGHT  -Reviewed etiology of plantar fibroma with patient.  Discussed treatment options with patient today, including cortisone injection, NSAID course of treatment, physical therapy.  With the patient's verbal consent, a corticosteroid injection was administered to the two plantar fibromas in the plantar aspect of the right foot along the medial plantar fascial band, consisting of a mixture of 1% lidocaine  plain, 0.5% Sensorcaine  plain, and Kenalog-10 for a total of 2 cc administered.  A Band-aid was applied. Pain level post-injection is 4/10.  Informed patient that these have a high recurrence rate.  These may get slightly larger and occasionally slightly smaller.  These could resolve with cortisone injections, or cortisone injections followed by ultrasound and physical therapy.  Recommend that he aggressively massage the area with a golf ball today, postinjection.  Follow-up as needed   Joe Murders, DPM, FACFAS [facility]     [address] [place], [place] [address]                Office [phone]  Fax [phone]

## 2023-09-13 ENCOUNTER — Encounter: Payer: Self-pay | Admitting: Podiatry
